# Patient Record
Sex: Male | Born: 1935 | Race: White | Hispanic: No | Marital: Single | Smoking: Former smoker
Health system: Southern US, Community
[De-identification: ages and names within clinical notes are randomized; demographics above are authoritative.]

## PROBLEM LIST (undated history)

## (undated) DIAGNOSIS — I459 Conduction disorder, unspecified: Secondary | ICD-10-CM

## (undated) DIAGNOSIS — K922 Gastrointestinal hemorrhage, unspecified: Secondary | ICD-10-CM

## (undated) DIAGNOSIS — I4891 Unspecified atrial fibrillation: Secondary | ICD-10-CM

## (undated) DIAGNOSIS — Z950 Presence of cardiac pacemaker: Secondary | ICD-10-CM

## (undated) HISTORY — PX: TRANSURETHRAL RESECTION OF PROSTATE: SHX73

## (undated) HISTORY — PX: HERNIA REPAIR: SHX51

## (undated) HISTORY — PX: CATARACT EXTRACTION, BILATERAL: SHX1313

---

## 2006-07-31 ENCOUNTER — Ambulatory Visit (INDEPENDENT_AMBULATORY_CARE_PROVIDER_SITE_OTHER): Admitting: Ophthalmology

## 2006-08-10 ENCOUNTER — Ambulatory Visit (INDEPENDENT_AMBULATORY_CARE_PROVIDER_SITE_OTHER): Admitting: Family

## 2006-08-14 ENCOUNTER — Encounter (INDEPENDENT_AMBULATORY_CARE_PROVIDER_SITE_OTHER): Payer: Self-pay | Admitting: Ophthalmology

## 2006-08-20 ENCOUNTER — Ambulatory Visit: Admitting: Ophthalmology

## 2006-08-28 ENCOUNTER — Ambulatory Visit (INDEPENDENT_AMBULATORY_CARE_PROVIDER_SITE_OTHER): Admitting: Ophthalmology

## 2006-09-25 ENCOUNTER — Ambulatory Visit (INDEPENDENT_AMBULATORY_CARE_PROVIDER_SITE_OTHER): Admitting: Ophthalmology

## 2006-10-09 ENCOUNTER — Ambulatory Visit (INDEPENDENT_AMBULATORY_CARE_PROVIDER_SITE_OTHER): Admitting: Ophthalmology

## 2006-10-16 ENCOUNTER — Ambulatory Visit (INDEPENDENT_AMBULATORY_CARE_PROVIDER_SITE_OTHER): Admitting: Ophthalmology

## 2006-11-13 ENCOUNTER — Encounter (INDEPENDENT_AMBULATORY_CARE_PROVIDER_SITE_OTHER): Admitting: Ophthalmology

## 2006-12-11 ENCOUNTER — Ambulatory Visit (INDEPENDENT_AMBULATORY_CARE_PROVIDER_SITE_OTHER): Admitting: Ophthalmology

## 2007-01-15 ENCOUNTER — Ambulatory Visit (INDEPENDENT_AMBULATORY_CARE_PROVIDER_SITE_OTHER): Admitting: Ophthalmology

## 2007-02-19 ENCOUNTER — Ambulatory Visit (INDEPENDENT_AMBULATORY_CARE_PROVIDER_SITE_OTHER): Admitting: Ophthalmology

## 2007-03-26 ENCOUNTER — Ambulatory Visit (INDEPENDENT_AMBULATORY_CARE_PROVIDER_SITE_OTHER): Admitting: Ophthalmology

## 2010-07-16 ENCOUNTER — Ambulatory Visit (INDEPENDENT_AMBULATORY_CARE_PROVIDER_SITE_OTHER): Admitting: Ophthalmology

## 2011-09-02 ENCOUNTER — Encounter (INDEPENDENT_AMBULATORY_CARE_PROVIDER_SITE_OTHER): Admitting: Ophthalmology

## 2011-09-02 ENCOUNTER — Ambulatory Visit (INDEPENDENT_AMBULATORY_CARE_PROVIDER_SITE_OTHER): Admitting: Ophthalmology

## 2012-12-14 ENCOUNTER — Ambulatory Visit (INDEPENDENT_AMBULATORY_CARE_PROVIDER_SITE_OTHER): Payer: Medicare Other | Admitting: Ophthalmology

## 2013-03-15 ENCOUNTER — Ambulatory Visit (INDEPENDENT_AMBULATORY_CARE_PROVIDER_SITE_OTHER): Payer: Medicare Other | Admitting: Ophthalmology

## 2013-06-28 ENCOUNTER — Ambulatory Visit (INDEPENDENT_AMBULATORY_CARE_PROVIDER_SITE_OTHER): Payer: Medicare Other | Admitting: Ophthalmology

## 2013-07-12 ENCOUNTER — Ambulatory Visit (INDEPENDENT_AMBULATORY_CARE_PROVIDER_SITE_OTHER): Payer: Medicare Other | Admitting: Ophthalmology

## 2014-02-14 ENCOUNTER — Ambulatory Visit (INDEPENDENT_AMBULATORY_CARE_PROVIDER_SITE_OTHER): Payer: Medicare Other | Admitting: Ophthalmology

## 2014-05-25 DIAGNOSIS — N400 Enlarged prostate without lower urinary tract symptoms: Secondary | ICD-10-CM | POA: Diagnosis not present

## 2014-05-25 DIAGNOSIS — E039 Hypothyroidism, unspecified: Secondary | ICD-10-CM | POA: Diagnosis not present

## 2014-05-25 DIAGNOSIS — I2510 Atherosclerotic heart disease of native coronary artery without angina pectoris: Secondary | ICD-10-CM | POA: Diagnosis not present

## 2014-05-27 DIAGNOSIS — M47816 Spondylosis without myelopathy or radiculopathy, lumbar region: Secondary | ICD-10-CM | POA: Diagnosis not present

## 2014-06-09 DIAGNOSIS — E039 Hypothyroidism, unspecified: Secondary | ICD-10-CM | POA: Diagnosis not present

## 2014-06-15 DIAGNOSIS — M47816 Spondylosis without myelopathy or radiculopathy, lumbar region: Secondary | ICD-10-CM | POA: Diagnosis not present

## 2014-07-13 DIAGNOSIS — I495 Sick sinus syndrome: Secondary | ICD-10-CM | POA: Diagnosis not present

## 2014-07-13 DIAGNOSIS — Z950 Presence of cardiac pacemaker: Secondary | ICD-10-CM | POA: Diagnosis not present

## 2014-07-14 DIAGNOSIS — I2510 Atherosclerotic heart disease of native coronary artery without angina pectoris: Secondary | ICD-10-CM | POA: Diagnosis not present

## 2014-07-14 DIAGNOSIS — Z950 Presence of cardiac pacemaker: Secondary | ICD-10-CM | POA: Diagnosis not present

## 2014-07-14 DIAGNOSIS — E782 Mixed hyperlipidemia: Secondary | ICD-10-CM | POA: Diagnosis not present

## 2014-07-14 DIAGNOSIS — I10 Essential (primary) hypertension: Secondary | ICD-10-CM | POA: Diagnosis not present

## 2014-07-23 DIAGNOSIS — J387 Other diseases of larynx: Secondary | ICD-10-CM | POA: Diagnosis not present

## 2014-07-23 DIAGNOSIS — R1310 Dysphagia, unspecified: Secondary | ICD-10-CM | POA: Diagnosis not present

## 2014-08-13 DIAGNOSIS — M5136 Other intervertebral disc degeneration, lumbar region: Secondary | ICD-10-CM | POA: Diagnosis not present

## 2014-08-21 DIAGNOSIS — Z006 Encounter for examination for normal comparison and control in clinical research program: Secondary | ICD-10-CM | POA: Diagnosis not present

## 2014-08-21 DIAGNOSIS — Z950 Presence of cardiac pacemaker: Secondary | ICD-10-CM | POA: Diagnosis not present

## 2014-08-21 DIAGNOSIS — I495 Sick sinus syndrome: Secondary | ICD-10-CM | POA: Diagnosis not present

## 2014-08-21 DIAGNOSIS — R55 Syncope and collapse: Secondary | ICD-10-CM | POA: Diagnosis not present

## 2014-08-29 ENCOUNTER — Encounter (INDEPENDENT_AMBULATORY_CARE_PROVIDER_SITE_OTHER): Payer: Medicare Other | Admitting: Ophthalmology

## 2014-09-02 DIAGNOSIS — M5136 Other intervertebral disc degeneration, lumbar region: Secondary | ICD-10-CM | POA: Diagnosis not present

## 2014-09-02 DIAGNOSIS — M5431 Sciatica, right side: Secondary | ICD-10-CM | POA: Diagnosis not present

## 2014-09-05 ENCOUNTER — Encounter (INDEPENDENT_AMBULATORY_CARE_PROVIDER_SITE_OTHER): Payer: Medicare Other | Admitting: Ophthalmology

## 2014-09-05 DIAGNOSIS — H4011X3 Primary open-angle glaucoma, severe stage: Secondary | ICD-10-CM | POA: Diagnosis not present

## 2014-10-12 DIAGNOSIS — M47816 Spondylosis without myelopathy or radiculopathy, lumbar region: Secondary | ICD-10-CM | POA: Diagnosis not present

## 2014-12-03 DIAGNOSIS — L570 Actinic keratosis: Secondary | ICD-10-CM | POA: Diagnosis not present

## 2014-12-03 DIAGNOSIS — L578 Other skin changes due to chronic exposure to nonionizing radiation: Secondary | ICD-10-CM | POA: Diagnosis not present

## 2014-12-03 DIAGNOSIS — L820 Inflamed seborrheic keratosis: Secondary | ICD-10-CM | POA: Diagnosis not present

## 2014-12-03 DIAGNOSIS — Z85828 Personal history of other malignant neoplasm of skin: Secondary | ICD-10-CM | POA: Diagnosis not present

## 2014-12-10 DIAGNOSIS — M545 Low back pain: Secondary | ICD-10-CM | POA: Diagnosis not present

## 2014-12-14 DIAGNOSIS — I495 Sick sinus syndrome: Secondary | ICD-10-CM | POA: Diagnosis not present

## 2014-12-14 DIAGNOSIS — Z950 Presence of cardiac pacemaker: Secondary | ICD-10-CM | POA: Diagnosis not present

## 2014-12-31 DIAGNOSIS — S86892A Other injury of other muscle(s) and tendon(s) at lower leg level, left leg, initial encounter: Secondary | ICD-10-CM | POA: Diagnosis not present

## 2015-01-05 DIAGNOSIS — K219 Gastro-esophageal reflux disease without esophagitis: Secondary | ICD-10-CM | POA: Diagnosis not present

## 2015-01-05 DIAGNOSIS — K5909 Other constipation: Secondary | ICD-10-CM | POA: Diagnosis not present

## 2015-01-06 DIAGNOSIS — I10 Essential (primary) hypertension: Secondary | ICD-10-CM | POA: Diagnosis not present

## 2015-01-06 DIAGNOSIS — E782 Mixed hyperlipidemia: Secondary | ICD-10-CM | POA: Diagnosis not present

## 2015-01-21 DIAGNOSIS — I2510 Atherosclerotic heart disease of native coronary artery without angina pectoris: Secondary | ICD-10-CM | POA: Diagnosis not present

## 2015-01-21 DIAGNOSIS — I361 Nonrheumatic tricuspid (valve) insufficiency: Secondary | ICD-10-CM | POA: Diagnosis not present

## 2015-01-21 DIAGNOSIS — L570 Actinic keratosis: Secondary | ICD-10-CM | POA: Diagnosis not present

## 2015-01-21 DIAGNOSIS — Z85828 Personal history of other malignant neoplasm of skin: Secondary | ICD-10-CM | POA: Diagnosis not present

## 2015-01-21 DIAGNOSIS — L578 Other skin changes due to chronic exposure to nonionizing radiation: Secondary | ICD-10-CM | POA: Diagnosis not present

## 2015-01-21 DIAGNOSIS — L820 Inflamed seborrheic keratosis: Secondary | ICD-10-CM | POA: Diagnosis not present

## 2015-01-27 DIAGNOSIS — R011 Cardiac murmur, unspecified: Secondary | ICD-10-CM | POA: Diagnosis not present

## 2015-01-27 DIAGNOSIS — I2510 Atherosclerotic heart disease of native coronary artery without angina pectoris: Secondary | ICD-10-CM | POA: Diagnosis not present

## 2015-01-27 DIAGNOSIS — Z950 Presence of cardiac pacemaker: Secondary | ICD-10-CM | POA: Diagnosis not present

## 2015-01-27 DIAGNOSIS — E782 Mixed hyperlipidemia: Secondary | ICD-10-CM | POA: Diagnosis not present

## 2015-01-28 DIAGNOSIS — K5909 Other constipation: Secondary | ICD-10-CM | POA: Diagnosis not present

## 2015-02-01 DIAGNOSIS — M5416 Radiculopathy, lumbar region: Secondary | ICD-10-CM | POA: Diagnosis not present

## 2015-02-19 DIAGNOSIS — L820 Inflamed seborrheic keratosis: Secondary | ICD-10-CM | POA: Diagnosis not present

## 2015-02-23 DIAGNOSIS — K5900 Constipation, unspecified: Secondary | ICD-10-CM | POA: Diagnosis not present

## 2015-02-27 ENCOUNTER — Encounter (INDEPENDENT_AMBULATORY_CARE_PROVIDER_SITE_OTHER): Payer: Medicare Other | Admitting: Ophthalmology

## 2015-03-08 DIAGNOSIS — M5136 Other intervertebral disc degeneration, lumbar region: Secondary | ICD-10-CM | POA: Diagnosis not present

## 2015-03-08 DIAGNOSIS — Z23 Encounter for immunization: Secondary | ICD-10-CM | POA: Diagnosis not present

## 2015-03-08 DIAGNOSIS — M1990 Unspecified osteoarthritis, unspecified site: Secondary | ICD-10-CM | POA: Diagnosis not present

## 2015-03-08 DIAGNOSIS — M5431 Sciatica, right side: Secondary | ICD-10-CM | POA: Diagnosis not present

## 2015-03-08 DIAGNOSIS — R252 Cramp and spasm: Secondary | ICD-10-CM | POA: Diagnosis not present

## 2015-03-09 DIAGNOSIS — I10 Essential (primary) hypertension: Secondary | ICD-10-CM | POA: Diagnosis not present

## 2015-03-09 DIAGNOSIS — M1380 Other specified arthritis, unspecified site: Secondary | ICD-10-CM | POA: Diagnosis not present

## 2015-03-09 DIAGNOSIS — M62830 Muscle spasm of back: Secondary | ICD-10-CM | POA: Diagnosis not present

## 2015-03-13 ENCOUNTER — Encounter (INDEPENDENT_AMBULATORY_CARE_PROVIDER_SITE_OTHER): Payer: Medicare Other | Admitting: Ophthalmology

## 2015-03-13 DIAGNOSIS — H401132 Primary open-angle glaucoma, bilateral, moderate stage: Secondary | ICD-10-CM | POA: Diagnosis not present

## 2015-03-30 DIAGNOSIS — Z006 Encounter for examination for normal comparison and control in clinical research program: Secondary | ICD-10-CM | POA: Diagnosis not present

## 2015-03-30 DIAGNOSIS — Z950 Presence of cardiac pacemaker: Secondary | ICD-10-CM | POA: Diagnosis not present

## 2015-04-01 DIAGNOSIS — M47816 Spondylosis without myelopathy or radiculopathy, lumbar region: Secondary | ICD-10-CM | POA: Diagnosis not present

## 2015-04-01 DIAGNOSIS — M1990 Unspecified osteoarthritis, unspecified site: Secondary | ICD-10-CM | POA: Diagnosis not present

## 2015-04-01 DIAGNOSIS — M5416 Radiculopathy, lumbar region: Secondary | ICD-10-CM | POA: Diagnosis not present

## 2015-04-09 DIAGNOSIS — M79662 Pain in left lower leg: Secondary | ICD-10-CM | POA: Diagnosis not present

## 2015-04-09 DIAGNOSIS — M7989 Other specified soft tissue disorders: Secondary | ICD-10-CM | POA: Diagnosis not present

## 2015-05-31 DIAGNOSIS — M545 Low back pain: Secondary | ICD-10-CM | POA: Diagnosis not present

## 2015-06-28 DIAGNOSIS — M47816 Spondylosis without myelopathy or radiculopathy, lumbar region: Secondary | ICD-10-CM | POA: Diagnosis not present

## 2015-06-30 DIAGNOSIS — R252 Cramp and spasm: Secondary | ICD-10-CM | POA: Diagnosis not present

## 2015-06-30 DIAGNOSIS — I10 Essential (primary) hypertension: Secondary | ICD-10-CM | POA: Diagnosis not present

## 2015-06-30 DIAGNOSIS — M5136 Other intervertebral disc degeneration, lumbar region: Secondary | ICD-10-CM | POA: Diagnosis not present

## 2015-07-16 DIAGNOSIS — M79605 Pain in left leg: Secondary | ICD-10-CM | POA: Diagnosis not present

## 2015-07-16 DIAGNOSIS — R531 Weakness: Secondary | ICD-10-CM | POA: Diagnosis not present

## 2015-07-16 DIAGNOSIS — M545 Low back pain: Secondary | ICD-10-CM | POA: Diagnosis not present

## 2015-07-19 DIAGNOSIS — I459 Conduction disorder, unspecified: Secondary | ICD-10-CM | POA: Diagnosis not present

## 2015-07-19 DIAGNOSIS — Z950 Presence of cardiac pacemaker: Secondary | ICD-10-CM | POA: Diagnosis not present

## 2015-07-19 DIAGNOSIS — M79605 Pain in left leg: Secondary | ICD-10-CM | POA: Diagnosis not present

## 2015-07-19 DIAGNOSIS — I495 Sick sinus syndrome: Secondary | ICD-10-CM | POA: Diagnosis not present

## 2015-07-19 DIAGNOSIS — M545 Low back pain: Secondary | ICD-10-CM | POA: Diagnosis not present

## 2015-07-19 DIAGNOSIS — R531 Weakness: Secondary | ICD-10-CM | POA: Diagnosis not present

## 2015-07-21 DIAGNOSIS — R531 Weakness: Secondary | ICD-10-CM | POA: Diagnosis not present

## 2015-07-21 DIAGNOSIS — M545 Low back pain: Secondary | ICD-10-CM | POA: Diagnosis not present

## 2015-07-21 DIAGNOSIS — M79605 Pain in left leg: Secondary | ICD-10-CM | POA: Diagnosis not present

## 2015-07-22 DIAGNOSIS — E782 Mixed hyperlipidemia: Secondary | ICD-10-CM | POA: Diagnosis not present

## 2015-07-22 DIAGNOSIS — I10 Essential (primary) hypertension: Secondary | ICD-10-CM | POA: Diagnosis not present

## 2015-07-26 DIAGNOSIS — R531 Weakness: Secondary | ICD-10-CM | POA: Diagnosis not present

## 2015-07-26 DIAGNOSIS — M545 Low back pain: Secondary | ICD-10-CM | POA: Diagnosis not present

## 2015-07-26 DIAGNOSIS — M79605 Pain in left leg: Secondary | ICD-10-CM | POA: Diagnosis not present

## 2015-07-27 DIAGNOSIS — I10 Essential (primary) hypertension: Secondary | ICD-10-CM | POA: Diagnosis not present

## 2015-07-27 DIAGNOSIS — I2510 Atherosclerotic heart disease of native coronary artery without angina pectoris: Secondary | ICD-10-CM | POA: Diagnosis not present

## 2015-07-27 DIAGNOSIS — Z950 Presence of cardiac pacemaker: Secondary | ICD-10-CM | POA: Diagnosis not present

## 2015-07-27 DIAGNOSIS — E782 Mixed hyperlipidemia: Secondary | ICD-10-CM | POA: Diagnosis not present

## 2015-08-02 DIAGNOSIS — M79605 Pain in left leg: Secondary | ICD-10-CM | POA: Diagnosis not present

## 2015-08-02 DIAGNOSIS — M545 Low back pain: Secondary | ICD-10-CM | POA: Diagnosis not present

## 2015-08-02 DIAGNOSIS — R531 Weakness: Secondary | ICD-10-CM | POA: Diagnosis not present

## 2015-08-04 DIAGNOSIS — R531 Weakness: Secondary | ICD-10-CM | POA: Diagnosis not present

## 2015-08-04 DIAGNOSIS — M545 Low back pain: Secondary | ICD-10-CM | POA: Diagnosis not present

## 2015-08-04 DIAGNOSIS — M79605 Pain in left leg: Secondary | ICD-10-CM | POA: Diagnosis not present

## 2015-08-23 DIAGNOSIS — M47816 Spondylosis without myelopathy or radiculopathy, lumbar region: Secondary | ICD-10-CM | POA: Diagnosis not present

## 2015-09-04 ENCOUNTER — Ambulatory Visit (INDEPENDENT_AMBULATORY_CARE_PROVIDER_SITE_OTHER): Payer: Medicare Other | Admitting: Ophthalmology

## 2015-09-18 ENCOUNTER — Ambulatory Visit (INDEPENDENT_AMBULATORY_CARE_PROVIDER_SITE_OTHER): Payer: Medicare Other | Admitting: Ophthalmology

## 2015-09-25 ENCOUNTER — Ambulatory Visit (INDEPENDENT_AMBULATORY_CARE_PROVIDER_SITE_OTHER): Payer: Medicare Other | Admitting: Ophthalmology

## 2015-09-25 DIAGNOSIS — H401132 Primary open-angle glaucoma, bilateral, moderate stage: Secondary | ICD-10-CM | POA: Diagnosis not present

## 2015-09-29 DIAGNOSIS — K5900 Constipation, unspecified: Secondary | ICD-10-CM | POA: Diagnosis not present

## 2015-09-29 DIAGNOSIS — R05 Cough: Secondary | ICD-10-CM | POA: Diagnosis not present

## 2015-09-29 DIAGNOSIS — E559 Vitamin D deficiency, unspecified: Secondary | ICD-10-CM | POA: Diagnosis not present

## 2015-10-11 DIAGNOSIS — M47816 Spondylosis without myelopathy or radiculopathy, lumbar region: Secondary | ICD-10-CM | POA: Diagnosis not present

## 2015-10-15 DIAGNOSIS — N419 Inflammatory disease of prostate, unspecified: Secondary | ICD-10-CM | POA: Diagnosis not present

## 2015-10-15 DIAGNOSIS — R3129 Other microscopic hematuria: Secondary | ICD-10-CM | POA: Diagnosis not present

## 2015-10-21 DIAGNOSIS — L819 Disorder of pigmentation, unspecified: Secondary | ICD-10-CM | POA: Diagnosis not present

## 2015-10-21 DIAGNOSIS — L570 Actinic keratosis: Secondary | ICD-10-CM | POA: Diagnosis not present

## 2015-10-21 DIAGNOSIS — L820 Inflamed seborrheic keratosis: Secondary | ICD-10-CM | POA: Diagnosis not present

## 2015-10-21 DIAGNOSIS — D485 Neoplasm of uncertain behavior of skin: Secondary | ICD-10-CM | POA: Diagnosis not present

## 2015-10-21 DIAGNOSIS — L80 Vitiligo: Secondary | ICD-10-CM | POA: Diagnosis not present

## 2015-10-21 DIAGNOSIS — L578 Other skin changes due to chronic exposure to nonionizing radiation: Secondary | ICD-10-CM | POA: Diagnosis not present

## 2015-10-21 DIAGNOSIS — L821 Other seborrheic keratosis: Secondary | ICD-10-CM | POA: Diagnosis not present

## 2015-10-25 DIAGNOSIS — N419 Inflammatory disease of prostate, unspecified: Secondary | ICD-10-CM | POA: Diagnosis not present

## 2015-11-11 DIAGNOSIS — N358 Other urethral stricture: Secondary | ICD-10-CM | POA: Diagnosis not present

## 2015-11-11 DIAGNOSIS — N398 Other specified disorders of urinary system: Secondary | ICD-10-CM | POA: Diagnosis not present

## 2015-11-11 DIAGNOSIS — N359 Urethral stricture, unspecified: Secondary | ICD-10-CM | POA: Diagnosis not present

## 2015-11-15 DIAGNOSIS — N419 Inflammatory disease of prostate, unspecified: Secondary | ICD-10-CM | POA: Diagnosis not present

## 2015-11-15 DIAGNOSIS — Z950 Presence of cardiac pacemaker: Secondary | ICD-10-CM | POA: Diagnosis not present

## 2016-02-13 DIAGNOSIS — Z23 Encounter for immunization: Secondary | ICD-10-CM | POA: Diagnosis not present

## 2016-04-10 ENCOUNTER — Ambulatory Visit
Admission: EM | Admit: 2016-04-10 | Discharge: 2016-04-10 | Disposition: A | Discharge status: Home / Self Care | Payer: Medicare Other | Attending: Family Medicine | Admitting: Family Medicine

## 2016-04-10 ENCOUNTER — Encounter: Payer: Self-pay | Admitting: *Deleted

## 2016-04-10 ENCOUNTER — Ambulatory Visit: Payer: Medicare Other

## 2016-04-10 DIAGNOSIS — R1033 Periumbilical pain: Secondary | ICD-10-CM | POA: Diagnosis not present

## 2016-04-10 DIAGNOSIS — R1084 Generalized abdominal pain: Secondary | ICD-10-CM | POA: Diagnosis present

## 2016-04-10 DIAGNOSIS — K5900 Constipation, unspecified: Secondary | ICD-10-CM

## 2016-04-10 HISTORY — DX: Presence of cardiac pacemaker: Z95.0

## 2016-04-10 HISTORY — DX: Unspecified atrial fibrillation: I48.91

## 2016-04-10 HISTORY — DX: Conduction disorder, unspecified: I45.9

## 2016-04-10 HISTORY — DX: Gastrointestinal hemorrhage, unspecified: K92.2

## 2016-04-10 LAB — COMPREHENSIVE METABOLIC PANEL
ALBUMIN: 4.2 g/dL (ref 3.5–5.0)
ALK PHOS: 72 U/L (ref 38–126)
ALT: 47 U/L (ref 17–63)
ANION GAP: 7 (ref 5–15)
AST: 32 U/L (ref 15–41)
BILIRUBIN TOTAL: 0.4 mg/dL (ref 0.3–1.2)
BUN: 21 mg/dL — AB (ref 6–20)
CALCIUM: 9 mg/dL (ref 8.9–10.3)
CO2: 26 mmol/L (ref 22–32)
Chloride: 99 mmol/L — ABNORMAL LOW (ref 101–111)
Creatinine, Ser: 1.06 mg/dL (ref 0.61–1.24)
GFR calc Af Amer: >60 mL/min (ref >60)
GFR calc non Af Amer: >60 mL/min (ref >60)
GLUCOSE: 109 mg/dL — AB (ref 65–99)
Potassium: 5.1 mmol/L (ref 3.5–5.1)
Sodium: 132 mmol/L — ABNORMAL LOW (ref 135–145)
TOTAL PROTEIN: 6.9 g/dL (ref 6.5–8.1)

## 2016-04-10 LAB — CBC WITH DIFFERENTIAL/PLATELET
BASOS PCT: 1 %
Basophils Absolute: 0 10*3/uL (ref 0–0.1)
Eosinophils Absolute: 0.2 10*3/uL (ref 0–0.7)
Eosinophils Relative: 2 %
HEMATOCRIT: 39.4 % — AB (ref 40.0–52.0)
HEMOGLOBIN: 13.7 g/dL (ref 13.0–18.0)
LYMPHS ABS: 1.4 10*3/uL (ref 1.0–3.6)
LYMPHS PCT: 15 %
MCH: 31.6 pg (ref 26.0–34.0)
MCHC: 34.7 g/dL (ref 32.0–36.0)
MCV: 91.1 fL (ref 80.0–100.0)
MONOS PCT: 7 %
Monocytes Absolute: 0.7 10*3/uL (ref 0.2–1.0)
NEUTROS ABS: 6.8 10*3/uL — AB (ref 1.4–6.5)
NEUTROS PCT: 75 %
Platelets: 247 10*3/uL (ref 150–440)
RBC: 4.33 MIL/uL — ABNORMAL LOW (ref 4.40–5.90)
RDW: 13.6 % (ref 11.5–14.5)
WBC: 9.2 10*3/uL (ref 3.8–10.6)

## 2016-04-10 NOTE — ED Provider Notes (Signed)
[facility]    CSN: [id number] Arrival date & time: 04/10/16  1448     History   Chief Complaint Chief Complaint  Patient presents with  . Abdominal Pain    HPI Mark Suarez is a 82 y.o. male.   82 yo male with a c/o generalized abdominal pains for the last 3-4 weeks. States he has a h/o intermittent constipation due to his chronic pain medication and usually takes Miralax every 3 days. Denies any melena, hematochezia, fevers, chills, vomiting.    The history is provided by the patient.  Abdominal Pain    Past Medical History:  Diagnosis Date  . Atrial fibrillation ([place])   . GI bleed   . Heart block   . Pacemaker     There are no active problems to display for this patient.   Past Surgical History:  Procedure Laterality Date  . CATARACT EXTRACTION, BILATERAL    . HERNIA REPAIR    . TRANSURETHRAL RESECTION OF PROSTATE         Home Medications    Prior to Admission medications   Medication Sig Start Date End Date Taking? Authorizing Provider  aspirin 81 MG chewable tablet Chew 81 mg by mouth daily.   Yes Historical Provider, MD  atorvastatin (LIPITOR) 20 MG tablet Take 20 mg by mouth daily.   Yes Historical Provider, MD  buprenorphine (BUTRANS) 20 MCG/HR PTWK patch Place 20 mcg onto the skin once a week.   Yes Historical Provider, MD  cholecalciferol (VITAMIN D) 1000 units tablet Take 2,000 Units by mouth daily.   Yes Historical Provider, MD  gabapentin (NEURONTIN) 300 MG capsule Take 300 mg by mouth daily.   Yes Historical Provider, MD  levothyroxine (SYNTHROID) 100 MCG tablet Take 100 mcg by mouth daily before breakfast.   Yes Historical Provider, MD  LORazepam (ATIVAN) 1 MG tablet Take 1 mg by mouth daily.   Yes Historical Provider, MD  losartan (COZAAR) 50 MG tablet Take 50 mg by mouth daily.   Yes Historical Provider, MD  metoprolol tartrate (LOPRESSOR) 25 MG tablet Take 25 mg by mouth daily.   Yes Historical Provider, MD    Family  History History reviewed. No pertinent family history.  Social History Social History  Substance Use Topics  . Smoking status: Former Research scientist (life sciences)  . Smokeless tobacco: Never Used  . Alcohol use Yes     Allergies   Patient has no known allergies.   Review of Systems Review of Systems  Gastrointestinal: Positive for abdominal pain.     Physical Exam Triage Vital Signs ED Triage Vitals  Enc Vitals Group     BP 04/10/16 1517 (!) 147/67     Pulse Rate 04/10/16 1517 62     Resp 04/10/16 1517 16     Temp 04/10/16 1517 97.6 F (36.4 C)     Temp Source 04/10/16 1517 Oral     SpO2 04/10/16 1517 100 %     Weight 04/10/16 1520 160 lb (72.6 kg)     Height 04/10/16 1520 5\' 8"  (1.727 m)     Head Circumference --      Peak Flow --      Pain Score 04/10/16 1531 3     Pain Loc --      Pain Edu? --      Excl. in [place]? --    No data found.   Updated Vital Signs BP (!) 147/67 (BP Location: Left Arm)   Pulse 62   Temp  97.6 F (36.4 C) (Oral)   Resp 16   Ht 5\' 8"  (1.727 m)   Wt 160 lb (72.6 kg)   SpO2 100%   BMI 24.33 kg/m   Visual Acuity Right Eye Distance:   Left Eye Distance:   Bilateral Distance:    Right Eye Near:   Left Eye Near:    Bilateral Near:     Physical Exam  Constitutional: He is oriented to person, place, and time. He appears well-developed and well-nourished. No distress.  HENT:  Head: Normocephalic and atraumatic.  Cardiovascular: Normal rate, regular rhythm, normal heart sounds and intact distal pulses.   No murmur heard. Pulmonary/Chest: Effort normal and breath sounds normal. No respiratory distress. He has no wheezes. He has no rales.  Abdominal: Soft. Bowel sounds are normal. He exhibits no distension and no mass. There is tenderness (mild, diffuse; no rebound or guarding). There is no rebound and no guarding.  Neurological: He is alert and oriented to person, place, and time.  Skin: No rash noted. He is not diaphoretic.  Nursing note and vitals  reviewed.    UC Treatments / Results  Labs (all labs ordered are listed, but only abnormal results are displayed) Labs Reviewed  CBC WITH DIFFERENTIAL/PLATELET - Abnormal; Notable for the following:       Result Value   RBC 4.33 (*)    HCT 39.4 (*)    Neutro Abs 6.8 (*)    All other components within normal limits  COMPREHENSIVE METABOLIC PANEL - Abnormal; Notable for the following:    Sodium 132 (*)    Chloride 99 (*)    Glucose, Bld 109 (*)    BUN 21 (*)    All other components within normal limits    EKG  EKG Interpretation None       Radiology Dg Abd 2 Views  Result Date: 04/10/2016 CLINICAL DATA:  Diffuse lower abdominal pain and periumbilical pain EXAM: ABDOMEN - 2 VIEW COMPARISON:  None. FINDINGS: Visualized lung bases clear. Partially visualized right atrial and right ventricular pacer leads. Bowel gas pattern is nonobstructed. Large amount of impacted feces in the right colon. Aortic vascular calcifications. Postsurgical changes lower pelvis. No acute osseous abnormality. IMPRESSION: Non obstructed gas pattern with large amount of right colon stool. Electronically Signed   By: Donavan Foil M.D.   On: 04/10/2016 16:34    Procedures Procedures (including critical care time)  Medications Ordered in UC Medications - No data to display   Initial Impression / Assessment and Plan / UC Course  I have reviewed the triage vital signs and the nursing notes.  Pertinent labs & imaging results that were available during my care of the patient were reviewed by me and considered in my medical decision making (see chart for details).  Clinical Course       Final Clinical Impressions(s) / UC Diagnoses   Final diagnoses:  Constipation, unspecified constipation type    New Prescriptions Discharge Medication List as of 04/10/2016  4:42 PM     1. Labs/x-ray results and diagnosis reviewed with patient 2. rx as per orders above; reviewed possible side effects,  interactions, risks and benefits  3. Recommend supportive treatment with increased water, increased Miralax frequency (daily), fleet enema 4. Follow-up prn if symptoms worsen or don't improve   Norval Gable, MD 04/10/16 2020

## 2016-04-10 NOTE — ED Triage Notes (Signed)
Patient has been having generalized abdominal pain for 3 to 4 weeks. No previous abdominal issues reported.

## 2016-06-06 DIAGNOSIS — R1084 Generalized abdominal pain: Secondary | ICD-10-CM | POA: Diagnosis not present

## 2016-06-06 DIAGNOSIS — K30 Functional dyspepsia: Secondary | ICD-10-CM | POA: Diagnosis not present

## 2016-06-06 DIAGNOSIS — K5909 Other constipation: Secondary | ICD-10-CM | POA: Diagnosis not present

## 2016-06-07 DIAGNOSIS — M48061 Spinal stenosis, lumbar region without neurogenic claudication: Secondary | ICD-10-CM | POA: Diagnosis not present

## 2016-06-07 DIAGNOSIS — K5900 Constipation, unspecified: Secondary | ICD-10-CM | POA: Diagnosis not present

## 2016-06-12 DIAGNOSIS — M47816 Spondylosis without myelopathy or radiculopathy, lumbar region: Secondary | ICD-10-CM | POA: Diagnosis not present

## 2016-06-14 DIAGNOSIS — Z950 Presence of cardiac pacemaker: Secondary | ICD-10-CM | POA: Diagnosis not present

## 2016-06-23 DIAGNOSIS — Z9861 Coronary angioplasty status: Secondary | ICD-10-CM | POA: Diagnosis not present

## 2016-06-23 DIAGNOSIS — I10 Essential (primary) hypertension: Secondary | ICD-10-CM | POA: Diagnosis not present

## 2016-06-23 DIAGNOSIS — I2510 Atherosclerotic heart disease of native coronary artery without angina pectoris: Secondary | ICD-10-CM | POA: Diagnosis not present

## 2016-06-23 DIAGNOSIS — E782 Mixed hyperlipidemia: Secondary | ICD-10-CM | POA: Diagnosis not present

## 2016-06-24 ENCOUNTER — Encounter (INDEPENDENT_AMBULATORY_CARE_PROVIDER_SITE_OTHER): Payer: Medicare Other | Admitting: Ophthalmology

## 2016-06-24 DIAGNOSIS — H401190 Primary open-angle glaucoma, unspecified eye, stage unspecified: Secondary | ICD-10-CM | POA: Diagnosis not present

## 2016-06-24 DIAGNOSIS — H401132 Primary open-angle glaucoma, bilateral, moderate stage: Secondary | ICD-10-CM | POA: Diagnosis not present

## 2016-06-27 DIAGNOSIS — D649 Anemia, unspecified: Secondary | ICD-10-CM | POA: Diagnosis not present

## 2016-06-27 DIAGNOSIS — D500 Iron deficiency anemia secondary to blood loss (chronic): Secondary | ICD-10-CM | POA: Diagnosis not present

## 2016-06-27 DIAGNOSIS — R5382 Chronic fatigue, unspecified: Secondary | ICD-10-CM | POA: Diagnosis not present

## 2016-06-27 DIAGNOSIS — E782 Mixed hyperlipidemia: Secondary | ICD-10-CM | POA: Diagnosis not present

## 2016-06-27 DIAGNOSIS — E559 Vitamin D deficiency, unspecified: Secondary | ICD-10-CM | POA: Diagnosis not present

## 2016-06-27 DIAGNOSIS — R002 Palpitations: Secondary | ICD-10-CM | POA: Diagnosis not present

## 2016-07-04 DIAGNOSIS — K5909 Other constipation: Secondary | ICD-10-CM | POA: Diagnosis not present

## 2016-07-04 DIAGNOSIS — R109 Unspecified abdominal pain: Secondary | ICD-10-CM | POA: Diagnosis not present

## 2016-07-05 DIAGNOSIS — R079 Chest pain, unspecified: Secondary | ICD-10-CM | POA: Diagnosis not present

## 2016-07-05 DIAGNOSIS — R3129 Other microscopic hematuria: Secondary | ICD-10-CM | POA: Diagnosis not present

## 2016-07-05 DIAGNOSIS — N419 Inflammatory disease of prostate, unspecified: Secondary | ICD-10-CM | POA: Diagnosis not present

## 2016-07-13 DIAGNOSIS — R194 Change in bowel habit: Secondary | ICD-10-CM | POA: Diagnosis not present

## 2016-07-13 DIAGNOSIS — K5730 Diverticulosis of large intestine without perforation or abscess without bleeding: Secondary | ICD-10-CM | POA: Diagnosis not present

## 2016-07-13 DIAGNOSIS — Z79891 Long term (current) use of opiate analgesic: Secondary | ICD-10-CM | POA: Diagnosis not present

## 2016-07-13 DIAGNOSIS — K5909 Other constipation: Secondary | ICD-10-CM | POA: Diagnosis not present

## 2016-07-13 DIAGNOSIS — I10 Essential (primary) hypertension: Secondary | ICD-10-CM | POA: Diagnosis not present

## 2016-07-13 DIAGNOSIS — E785 Hyperlipidemia, unspecified: Secondary | ICD-10-CM | POA: Diagnosis not present

## 2016-07-13 DIAGNOSIS — T40605A Adverse effect of unspecified narcotics, initial encounter: Secondary | ICD-10-CM | POA: Diagnosis not present

## 2016-07-13 DIAGNOSIS — R1013 Epigastric pain: Secondary | ICD-10-CM | POA: Diagnosis not present

## 2016-07-13 DIAGNOSIS — R109 Unspecified abdominal pain: Secondary | ICD-10-CM | POA: Diagnosis not present

## 2016-07-13 DIAGNOSIS — E039 Hypothyroidism, unspecified: Secondary | ICD-10-CM | POA: Diagnosis not present

## 2016-07-13 DIAGNOSIS — K5903 Drug induced constipation: Secondary | ICD-10-CM | POA: Diagnosis not present

## 2016-07-13 DIAGNOSIS — G8929 Other chronic pain: Secondary | ICD-10-CM | POA: Diagnosis not present

## 2016-07-13 DIAGNOSIS — M549 Dorsalgia, unspecified: Secondary | ICD-10-CM | POA: Diagnosis not present

## 2016-07-20 DIAGNOSIS — R1013 Epigastric pain: Secondary | ICD-10-CM | POA: Diagnosis not present

## 2016-07-20 DIAGNOSIS — R109 Unspecified abdominal pain: Secondary | ICD-10-CM | POA: Diagnosis not present

## 2016-07-20 DIAGNOSIS — K598 Other specified functional intestinal disorders: Secondary | ICD-10-CM | POA: Diagnosis not present

## 2016-07-26 DIAGNOSIS — K6389 Other specified diseases of intestine: Secondary | ICD-10-CM | POA: Diagnosis not present

## 2016-07-26 DIAGNOSIS — R109 Unspecified abdominal pain: Secondary | ICD-10-CM | POA: Diagnosis not present

## 2016-07-26 DIAGNOSIS — K5900 Constipation, unspecified: Secondary | ICD-10-CM | POA: Diagnosis not present

## 2016-08-04 DIAGNOSIS — R12 Heartburn: Secondary | ICD-10-CM | POA: Diagnosis not present

## 2016-08-04 DIAGNOSIS — N419 Inflammatory disease of prostate, unspecified: Secondary | ICD-10-CM | POA: Diagnosis not present

## 2016-08-21 DIAGNOSIS — M47816 Spondylosis without myelopathy or radiculopathy, lumbar region: Secondary | ICD-10-CM | POA: Diagnosis not present

## 2016-08-23 DIAGNOSIS — M5412 Radiculopathy, cervical region: Secondary | ICD-10-CM | POA: Diagnosis not present

## 2016-09-11 DIAGNOSIS — Z950 Presence of cardiac pacemaker: Secondary | ICD-10-CM | POA: Diagnosis not present

## 2016-09-28 DIAGNOSIS — G894 Chronic pain syndrome: Secondary | ICD-10-CM | POA: Diagnosis not present

## 2016-10-04 DIAGNOSIS — K5900 Constipation, unspecified: Secondary | ICD-10-CM | POA: Diagnosis not present

## 2016-11-13 DIAGNOSIS — M5412 Radiculopathy, cervical region: Secondary | ICD-10-CM | POA: Diagnosis not present

## 2016-11-23 DIAGNOSIS — L578 Other skin changes due to chronic exposure to nonionizing radiation: Secondary | ICD-10-CM | POA: Diagnosis not present

## 2016-11-23 DIAGNOSIS — Z1283 Encounter for screening for malignant neoplasm of skin: Secondary | ICD-10-CM | POA: Diagnosis not present

## 2016-11-23 DIAGNOSIS — D225 Melanocytic nevi of trunk: Secondary | ICD-10-CM | POA: Diagnosis not present

## 2016-11-23 DIAGNOSIS — L570 Actinic keratosis: Secondary | ICD-10-CM | POA: Diagnosis not present

## 2016-11-23 DIAGNOSIS — L821 Other seborrheic keratosis: Secondary | ICD-10-CM | POA: Diagnosis not present

## 2016-11-23 DIAGNOSIS — D485 Neoplasm of uncertain behavior of skin: Secondary | ICD-10-CM | POA: Diagnosis not present

## 2016-11-23 DIAGNOSIS — L820 Inflamed seborrheic keratosis: Secondary | ICD-10-CM | POA: Diagnosis not present

## 2016-12-11 DIAGNOSIS — I495 Sick sinus syndrome: Secondary | ICD-10-CM | POA: Diagnosis not present

## 2016-12-20 DIAGNOSIS — Z955 Presence of coronary angioplasty implant and graft: Secondary | ICD-10-CM | POA: Diagnosis not present

## 2016-12-20 DIAGNOSIS — I350 Nonrheumatic aortic (valve) stenosis: Secondary | ICD-10-CM | POA: Diagnosis not present

## 2016-12-20 DIAGNOSIS — I340 Nonrheumatic mitral (valve) insufficiency: Secondary | ICD-10-CM | POA: Diagnosis not present

## 2016-12-20 DIAGNOSIS — R262 Difficulty in walking, not elsewhere classified: Secondary | ICD-10-CM | POA: Diagnosis not present

## 2016-12-20 DIAGNOSIS — I2510 Atherosclerotic heart disease of native coronary artery without angina pectoris: Secondary | ICD-10-CM | POA: Diagnosis not present

## 2016-12-20 DIAGNOSIS — R9431 Abnormal electrocardiogram [ECG] [EKG]: Secondary | ICD-10-CM | POA: Diagnosis not present

## 2017-01-12 DIAGNOSIS — E782 Mixed hyperlipidemia: Secondary | ICD-10-CM | POA: Diagnosis not present

## 2017-01-12 DIAGNOSIS — I10 Essential (primary) hypertension: Secondary | ICD-10-CM | POA: Diagnosis not present

## 2017-01-12 DIAGNOSIS — I350 Nonrheumatic aortic (valve) stenosis: Secondary | ICD-10-CM | POA: Diagnosis not present

## 2017-01-15 DIAGNOSIS — M5412 Radiculopathy, cervical region: Secondary | ICD-10-CM | POA: Diagnosis not present

## 2017-02-06 DIAGNOSIS — M5412 Radiculopathy, cervical region: Secondary | ICD-10-CM | POA: Diagnosis not present

## 2017-02-09 DIAGNOSIS — R311 Benign essential microscopic hematuria: Secondary | ICD-10-CM | POA: Diagnosis not present

## 2017-02-09 DIAGNOSIS — D494 Neoplasm of unspecified behavior of bladder: Secondary | ICD-10-CM | POA: Diagnosis not present

## 2017-02-09 DIAGNOSIS — E039 Hypothyroidism, unspecified: Secondary | ICD-10-CM | POA: Diagnosis not present

## 2017-02-09 DIAGNOSIS — R3129 Other microscopic hematuria: Secondary | ICD-10-CM | POA: Diagnosis not present

## 2017-02-09 DIAGNOSIS — N419 Inflammatory disease of prostate, unspecified: Secondary | ICD-10-CM | POA: Diagnosis not present

## 2017-02-13 DIAGNOSIS — N390 Urinary tract infection, site not specified: Secondary | ICD-10-CM | POA: Diagnosis not present

## 2017-02-13 DIAGNOSIS — R319 Hematuria, unspecified: Secondary | ICD-10-CM | POA: Diagnosis not present

## 2017-02-13 DIAGNOSIS — Z23 Encounter for immunization: Secondary | ICD-10-CM | POA: Diagnosis not present

## 2017-02-22 DIAGNOSIS — R3129 Other microscopic hematuria: Secondary | ICD-10-CM | POA: Diagnosis not present

## 2017-02-28 DIAGNOSIS — N419 Inflammatory disease of prostate, unspecified: Secondary | ICD-10-CM | POA: Diagnosis not present

## 2017-02-28 DIAGNOSIS — R079 Chest pain, unspecified: Secondary | ICD-10-CM | POA: Diagnosis not present

## 2017-03-12 DIAGNOSIS — I495 Sick sinus syndrome: Secondary | ICD-10-CM | POA: Diagnosis not present

## 2017-03-15 DIAGNOSIS — Z950 Presence of cardiac pacemaker: Secondary | ICD-10-CM | POA: Diagnosis not present

## 2017-03-15 DIAGNOSIS — R3129 Other microscopic hematuria: Secondary | ICD-10-CM | POA: Diagnosis not present

## 2017-03-15 DIAGNOSIS — D494 Neoplasm of unspecified behavior of bladder: Secondary | ICD-10-CM | POA: Diagnosis not present

## 2017-03-15 DIAGNOSIS — R39198 Other difficulties with micturition: Secondary | ICD-10-CM | POA: Diagnosis not present

## 2017-03-15 DIAGNOSIS — I10 Essential (primary) hypertension: Secondary | ICD-10-CM | POA: Diagnosis not present

## 2017-03-21 DIAGNOSIS — K219 Gastro-esophageal reflux disease without esophagitis: Secondary | ICD-10-CM | POA: Diagnosis not present

## 2017-03-21 DIAGNOSIS — R319 Hematuria, unspecified: Secondary | ICD-10-CM | POA: Diagnosis not present

## 2017-03-26 DIAGNOSIS — M5412 Radiculopathy, cervical region: Secondary | ICD-10-CM | POA: Diagnosis not present

## 2017-04-17 DIAGNOSIS — K5909 Other constipation: Secondary | ICD-10-CM | POA: Diagnosis not present

## 2017-05-02 DIAGNOSIS — K219 Gastro-esophageal reflux disease without esophagitis: Secondary | ICD-10-CM | POA: Diagnosis not present

## 2017-05-02 DIAGNOSIS — K5909 Other constipation: Secondary | ICD-10-CM | POA: Diagnosis not present

## 2017-05-02 DIAGNOSIS — R1013 Epigastric pain: Secondary | ICD-10-CM | POA: Diagnosis not present

## 2017-06-02 ENCOUNTER — Encounter (INDEPENDENT_AMBULATORY_CARE_PROVIDER_SITE_OTHER): Payer: Self-pay | Admitting: Ophthalmology

## 2017-06-02 ENCOUNTER — Encounter (INDEPENDENT_AMBULATORY_CARE_PROVIDER_SITE_OTHER): Payer: Medicare Other | Admitting: Ophthalmology

## 2017-06-02 DIAGNOSIS — H401132 Primary open-angle glaucoma, bilateral, moderate stage: Secondary | ICD-10-CM | POA: Diagnosis not present

## 2017-06-02 DIAGNOSIS — H353131 Nonexudative age-related macular degeneration, bilateral, early dry stage: Secondary | ICD-10-CM | POA: Diagnosis not present

## 2017-06-02 DIAGNOSIS — Z0100 Encounter for examination of eyes and vision without abnormal findings: Secondary | ICD-10-CM

## 2017-06-05 DIAGNOSIS — K5909 Other constipation: Secondary | ICD-10-CM | POA: Diagnosis not present

## 2017-06-05 DIAGNOSIS — R109 Unspecified abdominal pain: Secondary | ICD-10-CM | POA: Diagnosis not present

## 2017-06-11 DIAGNOSIS — R51 Headache: Secondary | ICD-10-CM | POA: Diagnosis not present

## 2017-06-14 DIAGNOSIS — R7989 Other specified abnormal findings of blood chemistry: Secondary | ICD-10-CM | POA: Diagnosis not present

## 2017-06-14 DIAGNOSIS — R5382 Chronic fatigue, unspecified: Secondary | ICD-10-CM | POA: Diagnosis not present

## 2017-06-14 DIAGNOSIS — E7800 Pure hypercholesterolemia, unspecified: Secondary | ICD-10-CM | POA: Diagnosis not present

## 2017-06-14 DIAGNOSIS — R002 Palpitations: Secondary | ICD-10-CM | POA: Diagnosis not present

## 2017-06-14 DIAGNOSIS — D500 Iron deficiency anemia secondary to blood loss (chronic): Secondary | ICD-10-CM | POA: Diagnosis not present

## 2017-06-18 DIAGNOSIS — G5601 Carpal tunnel syndrome, right upper limb: Secondary | ICD-10-CM | POA: Diagnosis not present

## 2017-06-18 DIAGNOSIS — G5602 Carpal tunnel syndrome, left upper limb: Secondary | ICD-10-CM | POA: Diagnosis not present

## 2017-06-25 DIAGNOSIS — M5412 Radiculopathy, cervical region: Secondary | ICD-10-CM | POA: Diagnosis not present

## 2017-06-26 DIAGNOSIS — I350 Nonrheumatic aortic (valve) stenosis: Secondary | ICD-10-CM | POA: Diagnosis not present

## 2017-06-26 DIAGNOSIS — I10 Essential (primary) hypertension: Secondary | ICD-10-CM | POA: Diagnosis not present

## 2017-06-26 DIAGNOSIS — E782 Mixed hyperlipidemia: Secondary | ICD-10-CM | POA: Diagnosis not present

## 2017-06-27 DIAGNOSIS — M4802 Spinal stenosis, cervical region: Secondary | ICD-10-CM | POA: Diagnosis not present

## 2017-06-27 DIAGNOSIS — M542 Cervicalgia: Secondary | ICD-10-CM | POA: Diagnosis not present

## 2017-06-27 DIAGNOSIS — M47812 Spondylosis without myelopathy or radiculopathy, cervical region: Secondary | ICD-10-CM | POA: Diagnosis not present

## 2017-06-27 DIAGNOSIS — M9971 Connective tissue and disc stenosis of intervertebral foramina of cervical region: Secondary | ICD-10-CM | POA: Diagnosis not present

## 2017-06-27 DIAGNOSIS — R208 Other disturbances of skin sensation: Secondary | ICD-10-CM | POA: Diagnosis not present

## 2017-07-03 DIAGNOSIS — K219 Gastro-esophageal reflux disease without esophagitis: Secondary | ICD-10-CM | POA: Diagnosis not present

## 2017-07-03 DIAGNOSIS — M48061 Spinal stenosis, lumbar region without neurogenic claudication: Secondary | ICD-10-CM | POA: Diagnosis not present

## 2017-07-05 DIAGNOSIS — I495 Sick sinus syndrome: Secondary | ICD-10-CM | POA: Diagnosis not present

## 2017-07-10 DIAGNOSIS — H532 Diplopia: Secondary | ICD-10-CM | POA: Diagnosis not present

## 2017-07-10 DIAGNOSIS — H401131 Primary open-angle glaucoma, bilateral, mild stage: Secondary | ICD-10-CM | POA: Diagnosis not present

## 2017-07-10 DIAGNOSIS — H5053 Vertical heterophoria: Secondary | ICD-10-CM | POA: Diagnosis not present

## 2017-07-10 DIAGNOSIS — H5201 Hypermetropia, right eye: Secondary | ICD-10-CM | POA: Diagnosis not present

## 2017-07-10 DIAGNOSIS — H35373 Puckering of macula, bilateral: Secondary | ICD-10-CM | POA: Diagnosis not present

## 2017-07-10 DIAGNOSIS — H5212 Myopia, left eye: Secondary | ICD-10-CM | POA: Diagnosis not present

## 2017-07-10 DIAGNOSIS — H53143 Visual discomfort, bilateral: Secondary | ICD-10-CM | POA: Diagnosis not present

## 2017-07-10 DIAGNOSIS — H52223 Regular astigmatism, bilateral: Secondary | ICD-10-CM | POA: Diagnosis not present

## 2017-08-14 DIAGNOSIS — H6123 Impacted cerumen, bilateral: Secondary | ICD-10-CM | POA: Diagnosis not present

## 2017-08-14 DIAGNOSIS — R498 Other voice and resonance disorders: Secondary | ICD-10-CM | POA: Diagnosis not present

## 2017-08-14 DIAGNOSIS — H903 Sensorineural hearing loss, bilateral: Secondary | ICD-10-CM | POA: Diagnosis not present

## 2017-08-16 DIAGNOSIS — R109 Unspecified abdominal pain: Secondary | ICD-10-CM | POA: Diagnosis not present

## 2017-08-17 DIAGNOSIS — G5601 Carpal tunnel syndrome, right upper limb: Secondary | ICD-10-CM | POA: Diagnosis not present

## 2017-09-27 DIAGNOSIS — M5416 Radiculopathy, lumbar region: Secondary | ICD-10-CM | POA: Diagnosis not present

## 2017-09-27 DIAGNOSIS — G894 Chronic pain syndrome: Secondary | ICD-10-CM | POA: Diagnosis not present

## 2017-10-01 DIAGNOSIS — N419 Inflammatory disease of prostate, unspecified: Secondary | ICD-10-CM | POA: Diagnosis not present

## 2017-10-01 DIAGNOSIS — N3001 Acute cystitis with hematuria: Secondary | ICD-10-CM | POA: Diagnosis not present

## 2017-10-01 DIAGNOSIS — D689 Coagulation defect, unspecified: Secondary | ICD-10-CM | POA: Diagnosis not present

## 2017-10-01 DIAGNOSIS — D494 Neoplasm of unspecified behavior of bladder: Secondary | ICD-10-CM | POA: Diagnosis not present

## 2017-10-01 DIAGNOSIS — R311 Benign essential microscopic hematuria: Secondary | ICD-10-CM | POA: Diagnosis not present

## 2017-10-02 DIAGNOSIS — E039 Hypothyroidism, unspecified: Secondary | ICD-10-CM | POA: Diagnosis not present

## 2017-10-02 DIAGNOSIS — R9721 Rising PSA following treatment for malignant neoplasm of prostate: Secondary | ICD-10-CM | POA: Diagnosis not present

## 2017-10-02 DIAGNOSIS — R319 Hematuria, unspecified: Secondary | ICD-10-CM | POA: Diagnosis not present

## 2017-10-02 DIAGNOSIS — R946 Abnormal results of thyroid function studies: Secondary | ICD-10-CM | POA: Diagnosis not present

## 2017-10-02 DIAGNOSIS — I10 Essential (primary) hypertension: Secondary | ICD-10-CM | POA: Diagnosis not present

## 2017-10-15 DIAGNOSIS — Z950 Presence of cardiac pacemaker: Secondary | ICD-10-CM | POA: Diagnosis not present

## 2017-10-18 DIAGNOSIS — G894 Chronic pain syndrome: Secondary | ICD-10-CM | POA: Diagnosis not present

## 2017-10-18 DIAGNOSIS — M1711 Unilateral primary osteoarthritis, right knee: Secondary | ICD-10-CM | POA: Diagnosis not present

## 2017-10-18 DIAGNOSIS — M5416 Radiculopathy, lumbar region: Secondary | ICD-10-CM | POA: Diagnosis not present

## 2017-11-24 ENCOUNTER — Ambulatory Visit (INDEPENDENT_AMBULATORY_CARE_PROVIDER_SITE_OTHER): Payer: Medicare Other | Admitting: Ophthalmology

## 2017-11-24 DIAGNOSIS — H401132 Primary open-angle glaucoma, bilateral, moderate stage: Secondary | ICD-10-CM | POA: Diagnosis not present

## 2017-12-27 DIAGNOSIS — M5416 Radiculopathy, lumbar region: Secondary | ICD-10-CM | POA: Diagnosis not present

## 2017-12-27 DIAGNOSIS — M1711 Unilateral primary osteoarthritis, right knee: Secondary | ICD-10-CM | POA: Diagnosis not present

## 2017-12-27 DIAGNOSIS — G894 Chronic pain syndrome: Secondary | ICD-10-CM | POA: Diagnosis not present

## 2018-01-09 DIAGNOSIS — I350 Nonrheumatic aortic (valve) stenosis: Secondary | ICD-10-CM | POA: Diagnosis not present

## 2018-01-09 DIAGNOSIS — I340 Nonrheumatic mitral (valve) insufficiency: Secondary | ICD-10-CM | POA: Diagnosis not present

## 2018-01-22 DIAGNOSIS — I10 Essential (primary) hypertension: Secondary | ICD-10-CM | POA: Diagnosis not present

## 2018-01-22 DIAGNOSIS — Z955 Presence of coronary angioplasty implant and graft: Secondary | ICD-10-CM | POA: Diagnosis not present

## 2018-01-22 DIAGNOSIS — I2510 Atherosclerotic heart disease of native coronary artery without angina pectoris: Secondary | ICD-10-CM | POA: Diagnosis not present

## 2018-01-22 DIAGNOSIS — E782 Mixed hyperlipidemia: Secondary | ICD-10-CM | POA: Diagnosis not present

## 2018-01-24 DIAGNOSIS — R5383 Other fatigue: Secondary | ICD-10-CM | POA: Diagnosis not present

## 2018-01-24 DIAGNOSIS — Z23 Encounter for immunization: Secondary | ICD-10-CM | POA: Diagnosis not present

## 2018-01-24 DIAGNOSIS — R319 Hematuria, unspecified: Secondary | ICD-10-CM | POA: Diagnosis not present

## 2018-01-30 DIAGNOSIS — E785 Hyperlipidemia, unspecified: Secondary | ICD-10-CM | POA: Diagnosis not present

## 2018-01-30 DIAGNOSIS — F329 Major depressive disorder, single episode, unspecified: Secondary | ICD-10-CM | POA: Diagnosis not present

## 2018-01-30 DIAGNOSIS — R319 Hematuria, unspecified: Secondary | ICD-10-CM | POA: Diagnosis not present

## 2018-01-30 DIAGNOSIS — R5383 Other fatigue: Secondary | ICD-10-CM | POA: Diagnosis not present

## 2018-02-02 ENCOUNTER — Ambulatory Visit (INDEPENDENT_AMBULATORY_CARE_PROVIDER_SITE_OTHER): Payer: Medicare Other | Admitting: Ophthalmology

## 2018-02-02 DIAGNOSIS — H401132 Primary open-angle glaucoma, bilateral, moderate stage: Secondary | ICD-10-CM | POA: Diagnosis not present

## 2018-02-02 DIAGNOSIS — H401131 Primary open-angle glaucoma, bilateral, mild stage: Principal | ICD-10-CM

## 2018-02-04 DIAGNOSIS — L853 Xerosis cutis: Secondary | ICD-10-CM | POA: Diagnosis not present

## 2018-02-04 DIAGNOSIS — D3610 Benign neoplasm of peripheral nerves and autonomic nervous system, unspecified: Secondary | ICD-10-CM | POA: Diagnosis not present

## 2018-02-04 DIAGNOSIS — L814 Other melanin hyperpigmentation: Secondary | ICD-10-CM | POA: Diagnosis not present

## 2018-02-04 DIAGNOSIS — D1801 Hemangioma of skin and subcutaneous tissue: Secondary | ICD-10-CM | POA: Diagnosis not present

## 2018-02-04 DIAGNOSIS — L816 Other disorders of diminished melanin formation: Secondary | ICD-10-CM | POA: Diagnosis not present

## 2018-02-04 DIAGNOSIS — L820 Inflamed seborrheic keratosis: Secondary | ICD-10-CM | POA: Diagnosis not present

## 2018-02-04 DIAGNOSIS — I781 Nevus, non-neoplastic: Secondary | ICD-10-CM | POA: Diagnosis not present

## 2018-02-04 DIAGNOSIS — L570 Actinic keratosis: Secondary | ICD-10-CM | POA: Diagnosis not present

## 2018-02-20 DIAGNOSIS — R3129 Other microscopic hematuria: Secondary | ICD-10-CM | POA: Diagnosis not present

## 2018-02-20 DIAGNOSIS — N401 Enlarged prostate with lower urinary tract symptoms: Secondary | ICD-10-CM | POA: Diagnosis not present

## 2018-02-20 DIAGNOSIS — N419 Inflammatory disease of prostate, unspecified: Secondary | ICD-10-CM | POA: Diagnosis not present

## 2018-03-01 IMAGING — CR DG ABDOMEN 2V
3 series · 3 of 3 positions shown · non-contrast
Comparison: None.

CLINICAL DATA: Diffuse lower abdominal pain and periumbilical pain

EXAM:
ABDOMEN - 2 VIEW

[abdomen erect]
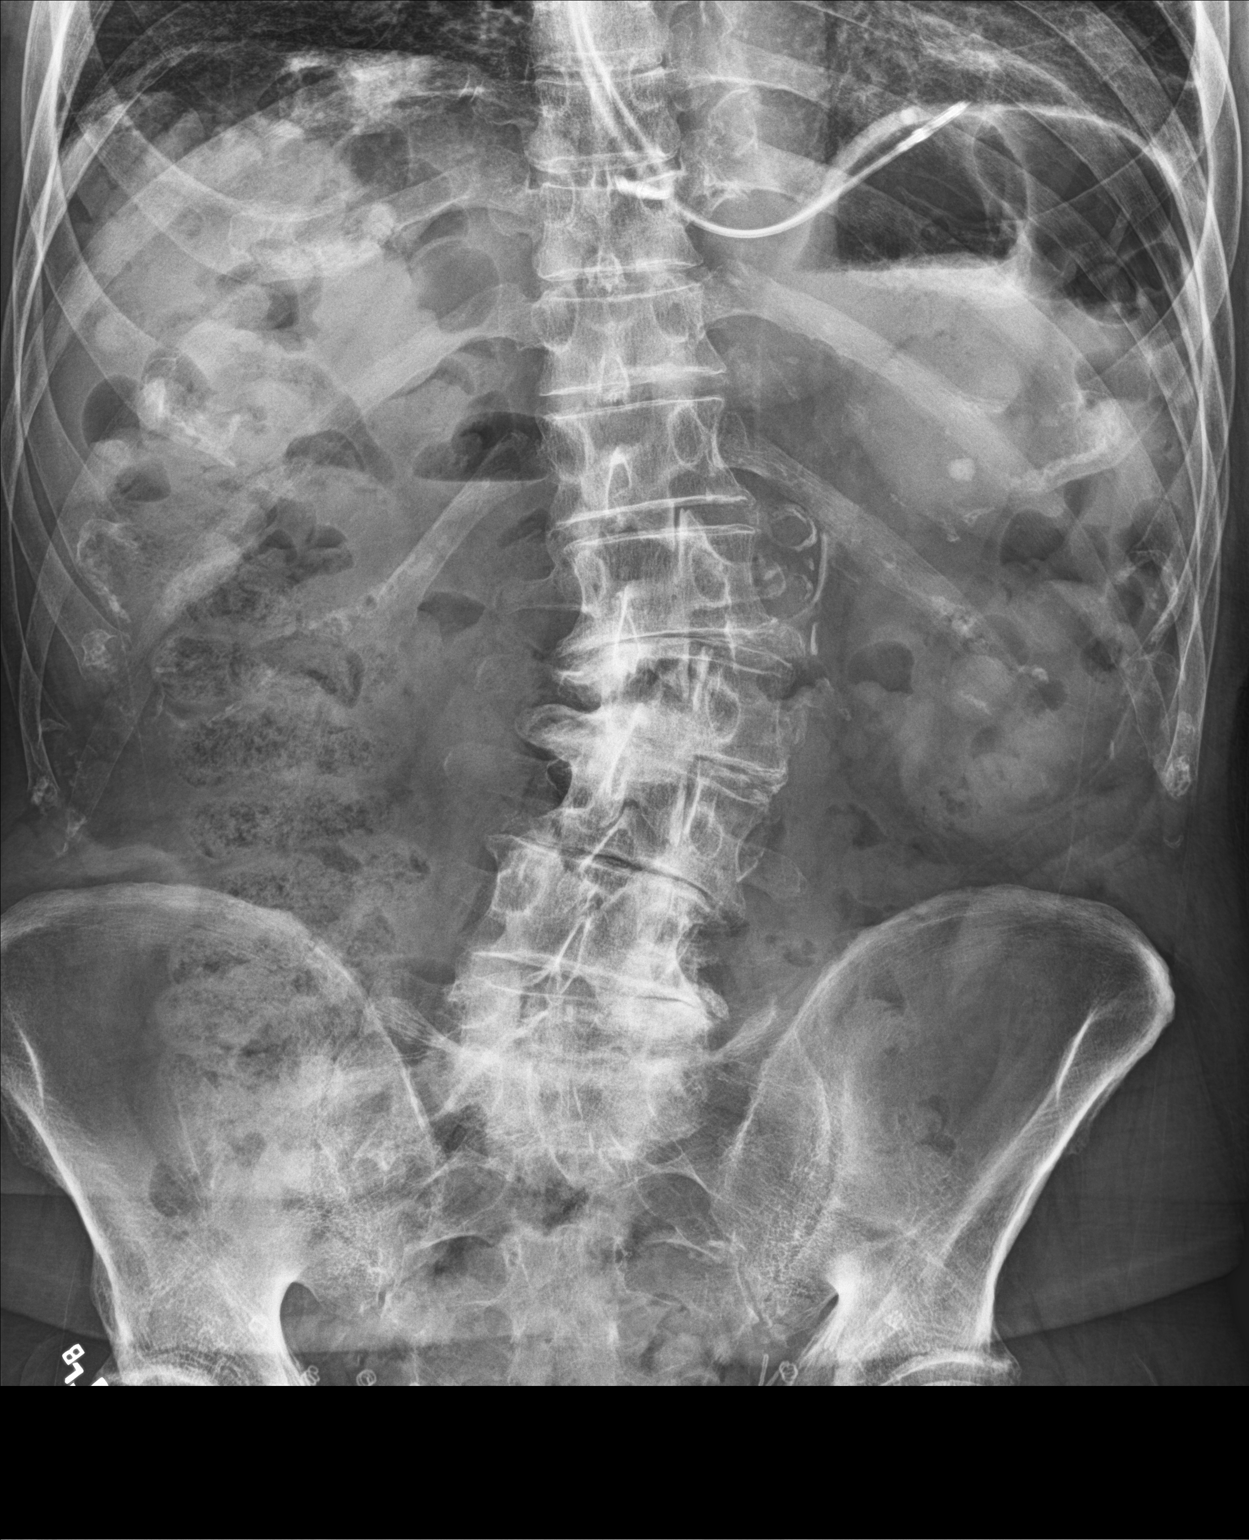

[abdomen supine (1 of 2)]
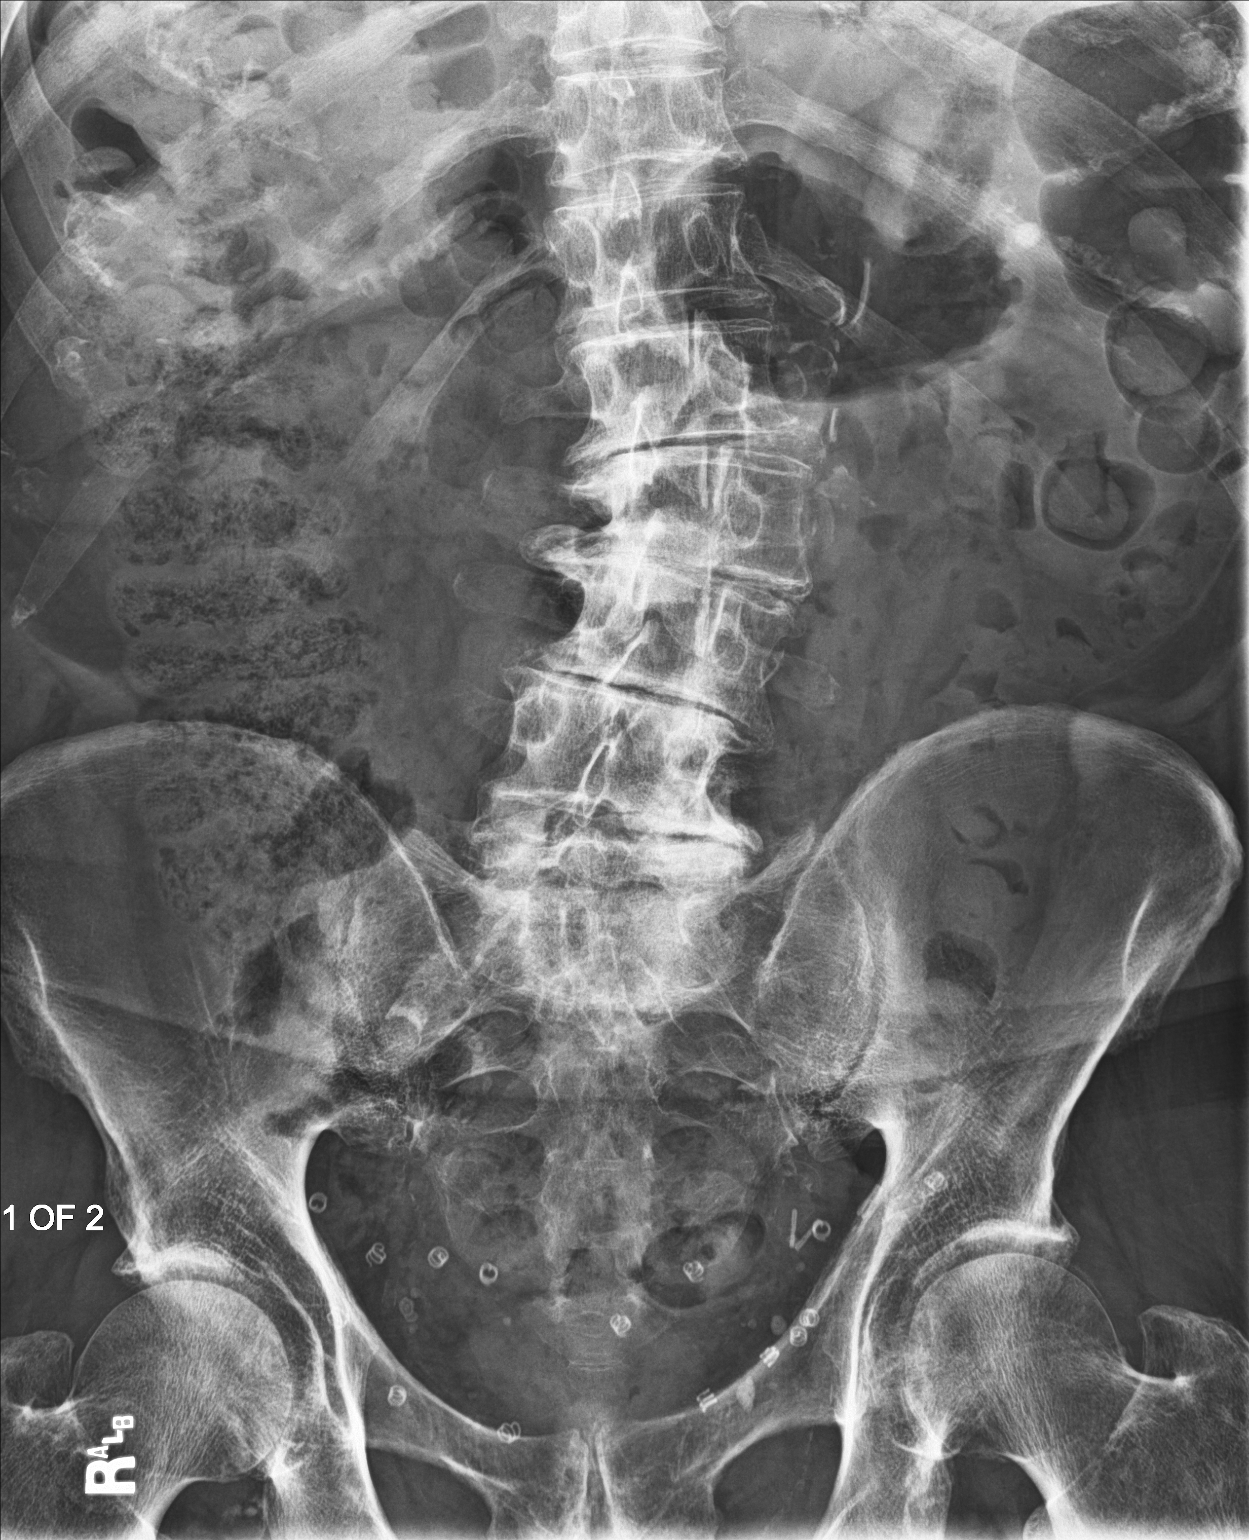

[abdomen supine (2 of 2)]
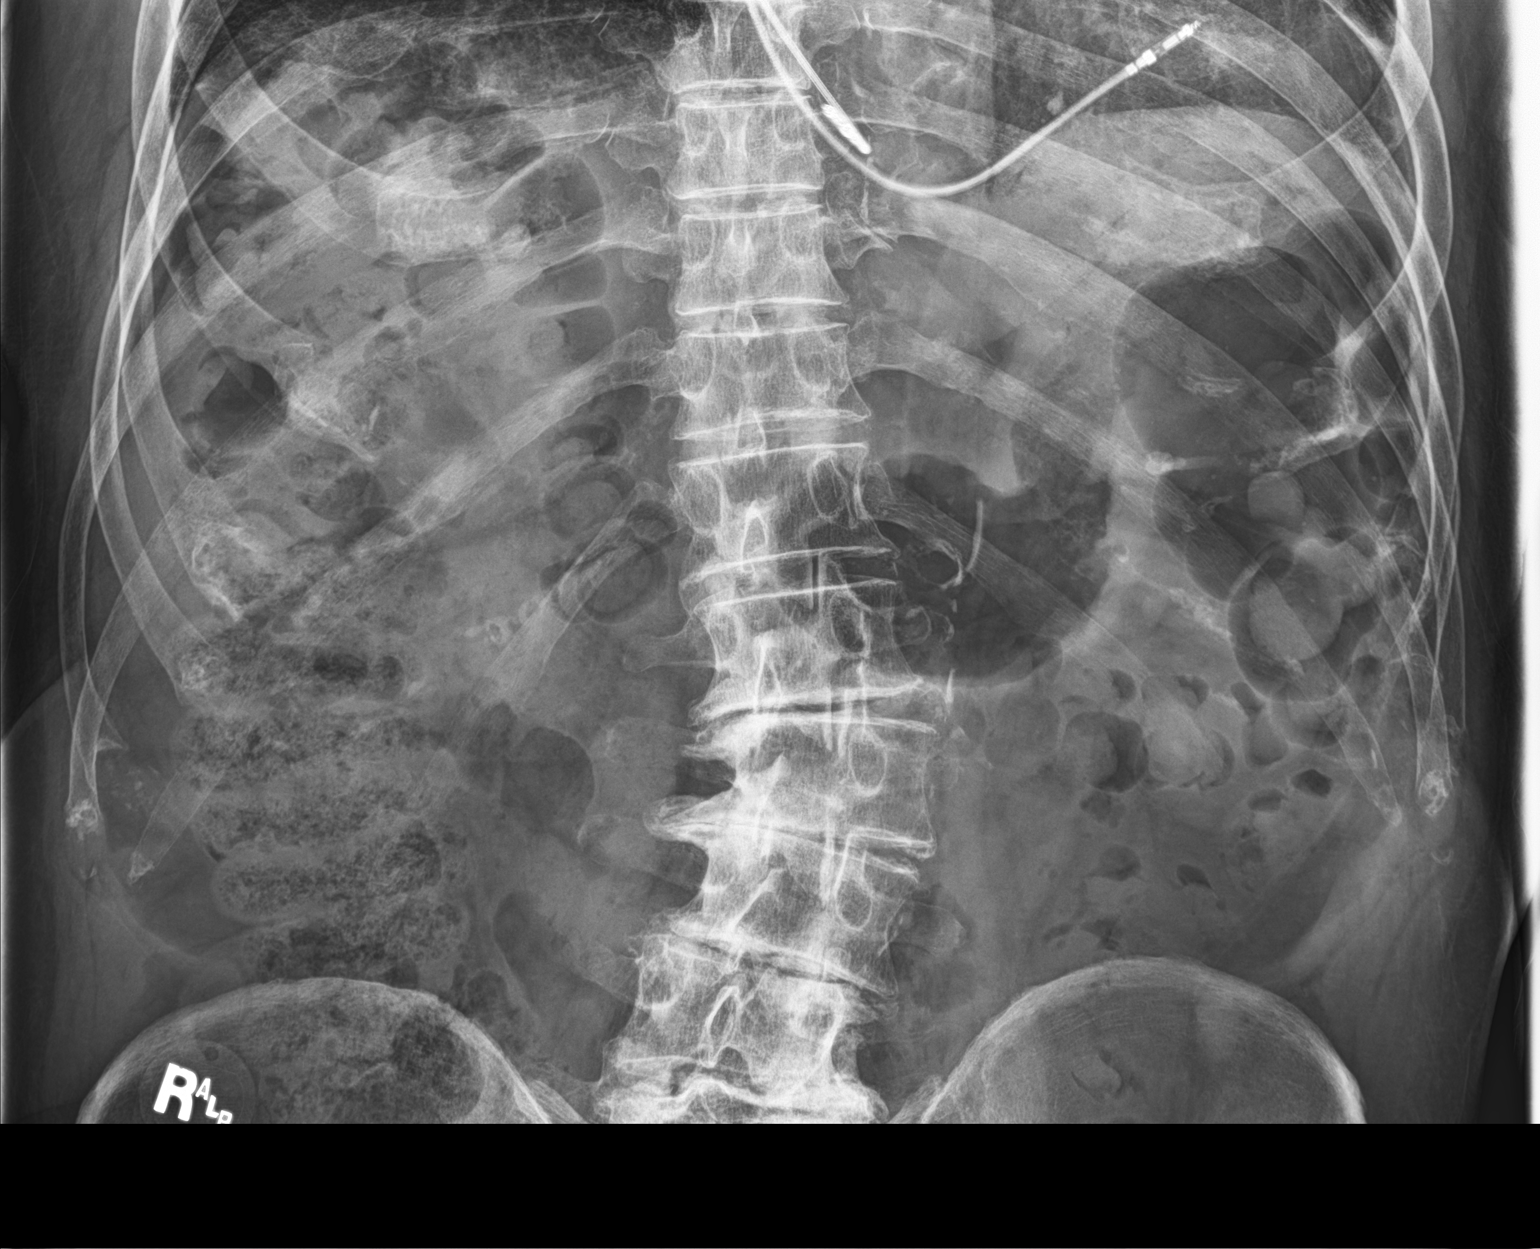

[3 of 3 positions shown; findings below may reference images not displayed]

FINDINGS: Visualized lung bases clear. Partially visualized right atrial and
right ventricular pacer leads. Bowel gas pattern is nonobstructed.
Large amount of impacted feces in the right colon. Aortic vascular
calcifications. Postsurgical changes lower pelvis. No acute osseous
abnormality.
IMPRESSION: Non obstructed gas pattern with large amount of right colon stool.

## 2018-03-14 DIAGNOSIS — M1711 Unilateral primary osteoarthritis, right knee: Secondary | ICD-10-CM | POA: Diagnosis not present

## 2018-03-14 DIAGNOSIS — M5416 Radiculopathy, lumbar region: Secondary | ICD-10-CM | POA: Diagnosis not present

## 2018-03-14 DIAGNOSIS — G894 Chronic pain syndrome: Secondary | ICD-10-CM | POA: Diagnosis not present

## 2018-03-15 DIAGNOSIS — G4733 Obstructive sleep apnea (adult) (pediatric): Secondary | ICD-10-CM | POA: Diagnosis not present

## 2018-03-15 DIAGNOSIS — R5382 Chronic fatigue, unspecified: Secondary | ICD-10-CM | POA: Diagnosis not present

## 2018-03-25 DIAGNOSIS — I10 Essential (primary) hypertension: Secondary | ICD-10-CM | POA: Diagnosis not present

## 2018-03-25 DIAGNOSIS — G4733 Obstructive sleep apnea (adult) (pediatric): Secondary | ICD-10-CM | POA: Diagnosis not present

## 2018-03-25 DIAGNOSIS — I2510 Atherosclerotic heart disease of native coronary artery without angina pectoris: Secondary | ICD-10-CM | POA: Diagnosis not present

## 2018-04-01 DIAGNOSIS — G4733 Obstructive sleep apnea (adult) (pediatric): Secondary | ICD-10-CM | POA: Diagnosis not present

## 2018-04-15 DIAGNOSIS — Z950 Presence of cardiac pacemaker: Secondary | ICD-10-CM | POA: Diagnosis not present

## 2018-08-03 ENCOUNTER — Ambulatory Visit (INDEPENDENT_AMBULATORY_CARE_PROVIDER_SITE_OTHER): Payer: Medicare Other | Admitting: Ophthalmology

## 2018-08-10 ENCOUNTER — Ambulatory Visit (INDEPENDENT_AMBULATORY_CARE_PROVIDER_SITE_OTHER): Payer: Medicare Other | Admitting: Ophthalmology

## 2018-10-02 ENCOUNTER — Ambulatory Visit (INDEPENDENT_AMBULATORY_CARE_PROVIDER_SITE_OTHER): Payer: Medicare Other | Admitting: Ophthalmology

## 2018-10-02 DIAGNOSIS — H401130 Primary open-angle glaucoma, bilateral, stage unspecified: Secondary | ICD-10-CM

## 2019-03-24 DIAGNOSIS — H401132 Primary open-angle glaucoma, bilateral, moderate stage: Secondary | ICD-10-CM | POA: Insufficient documentation

## 2019-03-24 NOTE — Progress Notes (Signed)
PAST OCULAR HISTORY:  1. POAG OU   - Gonio: pseudophakic   - [place] photo:    - Tmax: 33/53   - Target: 15/15   - OD: ALT 1987, ALT 2003, trab/mmc 04/14/02, CEIOL 08/11/02   - OS: ALT 1987, ALT 1995, [place], [facility] 11/1999, trab+[facility] 01/2002  2. ERM OD s/p EMP 2012    IMAGING:  - RNFL   OD: low quality,  full,  no prior   OS: low quality,  thin,  no prior     ASSESSMENT/PLAN:  POAG OU  - Meds: none              Intolerances: Br, Lat, CAI  - IOP: at goal OD and borderline OS   - Plan: CPM given IOP at goal, defer further meds given numerous intolerances              Is having stress test in near future so will delay f/u given prior stability while he recovers    RTC 6 mo  HVF 24-2 OU  RNFL OU

## 2019-03-28 ENCOUNTER — Ambulatory Visit (INDEPENDENT_AMBULATORY_CARE_PROVIDER_SITE_OTHER): Payer: Medicare Other | Admitting: Ophthalmology

## 2019-03-28 DIAGNOSIS — H401132 Primary open-angle glaucoma, bilateral, moderate stage: Secondary | ICD-10-CM

## 2019-06-30 ENCOUNTER — Encounter (INDEPENDENT_AMBULATORY_CARE_PROVIDER_SITE_OTHER): Payer: Self-pay

## 2019-07-14 ENCOUNTER — Telehealth (INDEPENDENT_AMBULATORY_CARE_PROVIDER_SITE_OTHER): Payer: Self-pay

## 2019-07-14 NOTE — Telephone Encounter (Signed)
Patient was identified from a Psychologist, educational for follow up.  Confirmed that patient already had previous COVID vaccinations done at [facility] on 06/06/19 and 07/04/19 and updated the chart to reflect.

## 2019-09-01 ENCOUNTER — Encounter (INDEPENDENT_AMBULATORY_CARE_PROVIDER_SITE_OTHER): Payer: Self-pay | Admitting: Ophthalmology

## 2019-09-01 DIAGNOSIS — H409 Unspecified glaucoma: Secondary | ICD-10-CM

## 2019-09-25 NOTE — Progress Notes (Signed)
PAST OCULAR HISTORY:  1. POAG OU   - Gonio: pseudophakic   - ONH photo: -   - Tmax: 33/53   - Target: 15/15   - OD: ALT 1987, ALT 2003, trab/[facility] 04/14/02, CEIOL 08/11/02   - OS: ALT 1987, ALT 1995, [place], trab+[facility] 11/1999, trab+[facility] 01/2002  2. ERM OD s/p EMP 2012    IMAGING:  - [employer] Visual [employer]   OD: reliable,  biarcuate,  stable   OS: reliable,  superior arcuate,  stable  - RNFL   OD: low quality,  thin,  possible progression   OS: low quality,  thin,  stable     ASSESSMENT/PLAN:  POAG OU  - Meds: none              Intolerances: Br, Lat, CAI  - IOP: at goal OD and above goal OS   - Plan: IOP above goal OS however testing stable (possible progression OD may be subject to low quality scan). Defer further meds given numerous intolerances for now, CPM. Could consider Zio in the future if needs additional pressure lowering. For now start massage OS and monitor for response.             Will trial GCC at f/u to monitor for hypotony maculopathy at next visit.    RTC 6 mo  HVF 24-2 OU  DFE  [place] OU

## 2019-09-26 ENCOUNTER — Ambulatory Visit (INDEPENDENT_AMBULATORY_CARE_PROVIDER_SITE_OTHER): Payer: Medicare Other

## 2019-09-26 ENCOUNTER — Ambulatory Visit (INDEPENDENT_AMBULATORY_CARE_PROVIDER_SITE_OTHER): Payer: Medicare Other | Admitting: Ophthalmology

## 2019-09-26 DIAGNOSIS — H401132 Primary open-angle glaucoma, bilateral, moderate stage: Secondary | ICD-10-CM

## 2019-09-26 DIAGNOSIS — H409 Unspecified glaucoma: Secondary | ICD-10-CM

## 2020-02-09 ENCOUNTER — Ambulatory Visit (INDEPENDENT_AMBULATORY_CARE_PROVIDER_SITE_OTHER): Payer: Medicare Other | Admitting: Optometrist

## 2020-02-09 ENCOUNTER — Encounter (INDEPENDENT_AMBULATORY_CARE_PROVIDER_SITE_OTHER): Payer: Self-pay | Admitting: Optometrist

## 2020-02-09 DIAGNOSIS — H401134 Primary open-angle glaucoma, bilateral, indeterminate stage: Secondary | ICD-10-CM

## 2020-02-09 DIAGNOSIS — H35351 Cystoid macular degeneration, right eye: Secondary | ICD-10-CM

## 2020-02-09 DIAGNOSIS — D3131 Benign neoplasm of right choroid: Secondary | ICD-10-CM

## 2020-02-09 DIAGNOSIS — H539 Unspecified visual disturbance: Secondary | ICD-10-CM

## 2020-02-09 NOTE — Progress Notes (Signed)
Ophthalmology Service  OPTOMETRY PROGRESS NOTE      HPI     Reasons for Visit: urgent care OS     85 year old Franklin Davis reported the following symptoms: curtain inf/temp OD x 3 days; no flashes or floaters; OD blurred x 3 days      Eye Medications: none    POH:  (+) Eye Surgery:  trab ou; sp CEIOL ou; retinal laser ou  (-) Ocular Trauma  (+) Eye Disease:POAG     PMHX:  CAD, arthritis    No results found for: A1C      Last edited by Jama Flavors, OD on 4/56/2563  4:21 PM. (History)          Assessment/Plan  1. Visual disturbance    optos  OD: superior laser, nevus  OS: wnl    Oct macula  OD:irf  OS: drusen    - Fundus Photos - OU - Both Eyes -[facility]  - OCT, Retina - OU - Both Eyes -[place]    2. CME (cystoid macular edema), right  Unclear etiology,discussed w Dr. Nicoletta Dress, fu w retina clinic x 2 weeks    3. Primary open angle glaucoma (POAG) of both eyes, indeterminate stage  iop stable ou, cont fu w Dr. Tyler Pita    4. Choroidal nevus, right  Observe    5. Ho ERM OD:   s/p EMP 2012, fu w retina clinic        RTC in Return in about 2 weeks (around 02/23/2020) for retina clinic (pls ob per Dr. Nicoletta Dress).      Franklin Davis, O.D., F.A.A.O., [phone]

## 2020-02-23 ENCOUNTER — Ambulatory Visit (INDEPENDENT_AMBULATORY_CARE_PROVIDER_SITE_OTHER): Payer: Medicare Other

## 2020-03-08 ENCOUNTER — Encounter (INDEPENDENT_AMBULATORY_CARE_PROVIDER_SITE_OTHER): Payer: Self-pay | Admitting: Ophthalmology

## 2020-03-08 DIAGNOSIS — H409 Unspecified glaucoma: Secondary | ICD-10-CM

## 2020-03-30 NOTE — Progress Notes (Signed)
PAST OCULAR HISTORY:  1. POAG OU              - Gonio: pseudophakic              - [place] photo: 02/09/20 (Optos)              - Tmax: 33/53              - Target: 15/15              - OD: ALT 1987, ALT 2003, trab/mmc 04/14/02, CEIOL 08/11/02              - OS: ALT 1987, ALT 1995, [place], trab+[facility] 11/1999, trab+[facility] 01/2002  2. ERM OD s/p EMP 2012    IMAGING:  - Humphrey Visual Field   OD: reliable,  biarcuate,  stable   OS: reliable,  inferior arcutae,  stable  - RNFL  OD: borderline,  stable  OS: thin,  stable      ASSESSMENT/PLAN:  POAG OU  - Meds: none              Intolerances: Br, Lat, CAI  - IOP: at goal OU   - Plan: At last visit started massage OS. Low threshold to start Zio.   Seen in September w/ decreased vision OD and significant IRF. Choroid appeared flat so unlikely hypotonous. Given trab functioning well can start PF QID. Went to [facility] 9/24 and 10/20 and was diagnosed with wet AMD and received injections OD, dry AMD OS.      RTC 6 mo  HVF 24-2 OU  RNFL

## 2020-04-02 ENCOUNTER — Ambulatory Visit (INDEPENDENT_AMBULATORY_CARE_PROVIDER_SITE_OTHER): Payer: Medicare Other | Admitting: Ophthalmology

## 2020-04-02 ENCOUNTER — Ambulatory Visit (INDEPENDENT_AMBULATORY_CARE_PROVIDER_SITE_OTHER): Payer: Medicare Other

## 2020-04-02 DIAGNOSIS — H409 Unspecified glaucoma: Secondary | ICD-10-CM

## 2020-04-02 DIAGNOSIS — H401132 Primary open-angle glaucoma, bilateral, moderate stage: Secondary | ICD-10-CM

## 2020-09-15 ENCOUNTER — Encounter (INDEPENDENT_AMBULATORY_CARE_PROVIDER_SITE_OTHER): Payer: Self-pay | Admitting: Ophthalmology

## 2020-09-15 DIAGNOSIS — H409 Unspecified glaucoma: Secondary | ICD-10-CM

## 2020-09-28 NOTE — Progress Notes (Signed)
PAST OCULAR HISTORY:  1. POAG OU              - Gonio: pseudophakic              - [place] photo: 02/09/20 (Optos)              - Tmax: 33/53              - Target: 15/15              - OD: ALT 1987, ALT 2003, trab/mmc 04/14/02, CEIOL 08/11/02              - OS: ALT 1987, ALT 1995, [place], trab+[facility] 11/1999, trab+[facility] 01/2002  2. ERM OD s/p EMP 2012  3. AMD OU: f/b [facility]   - OD: s/p IVA    IMAGING:  - Humphrey Visual Field   OD: reliable,  inferior arcutae,  stable   OS: reliable,  superior arcuate,  stable  - RNFL   OD: full,  stable   OS: borderline,  stable       ASSESSMENT/PLAN:  POAG OU  - Meds: Push 0/2              Intolerances: Br, Lat, CAI  - IOP: at goal OU   - Plan: Low threshold to start Zio.   Has been receiving injection OD, having some improvement in vision.         RTC 6 mo  HVF 24-2 OU  DFE  RNFL

## 2020-10-01 ENCOUNTER — Ambulatory Visit (INDEPENDENT_AMBULATORY_CARE_PROVIDER_SITE_OTHER): Payer: Medicare Other

## 2020-10-01 ENCOUNTER — Ambulatory Visit (INDEPENDENT_AMBULATORY_CARE_PROVIDER_SITE_OTHER): Payer: Medicare Other | Admitting: Ophthalmology

## 2020-10-01 DIAGNOSIS — H401132 Primary open-angle glaucoma, bilateral, moderate stage: Secondary | ICD-10-CM

## 2020-10-01 DIAGNOSIS — H409 Unspecified glaucoma: Secondary | ICD-10-CM

## 2021-02-28 ENCOUNTER — Encounter (INDEPENDENT_AMBULATORY_CARE_PROVIDER_SITE_OTHER): Payer: Self-pay

## 2021-02-28 DIAGNOSIS — H401132 Primary open-angle glaucoma, bilateral, moderate stage: Secondary | ICD-10-CM

## 2021-04-07 NOTE — Progress Notes (Signed)
PAST OCULAR HISTORY:  1. POAG OU              - Gonio: pseudophakic              - [place] photo: 02/09/20 (Optos)              - Tmax: 33/53              - Target: 15/15              - OD: ALT 1987, ALT 2003, trab/mmc 04/14/02, CEIOL 08/11/02              - OS: ALT 1987, ALT 1995, [place], [facility] 11/1999, trab+[facility] 01/2002  2. ERM OD s/p EMP 2012  3. AMD OU: f/b [facility]   - OD: s/p IVA    IMAGING:  - Humphrey Visual Field   OD: reliable,  superior arcuate,  stable   OS: reliable,  superior arcuate,  stable within variability  - RNFL   OD: full,  stable   OS: thin,  stable       ASSESSMENT/PLAN:  POAG OU  - Meds: Push 0/2              Intolerances: Br, Lat, CAI  - IOP: at goal OU    - Plan: Low threshold to start Zio.   Has been receiving injection OD, having some improvement in vision although worse again today.   Will decrease HVF to annual per request. His AMD influences OD and his lid influences OS.    Could avoid future dilation and do nDFE since he gets regular dilation w/ Retina.         RTC 6 mo  RNFL

## 2021-04-08 ENCOUNTER — Ambulatory Visit (INDEPENDENT_AMBULATORY_CARE_PROVIDER_SITE_OTHER): Payer: Medicare Other | Admitting: Ophthalmology

## 2021-04-08 ENCOUNTER — Ambulatory Visit (INDEPENDENT_AMBULATORY_CARE_PROVIDER_SITE_OTHER): Payer: Medicare Other

## 2021-04-08 DIAGNOSIS — H401132 Primary open-angle glaucoma, bilateral, moderate stage: Secondary | ICD-10-CM

## 2021-04-08 MED ORDER — OXYCODONE HCL 5 MG OR TABS
ORAL_TABLET | ORAL | Status: AC
Start: 2021-02-01 — End: ?

## 2021-04-08 MED ORDER — GEMTESA 75 MG PO TABS: ORAL_TABLET | ORAL | Status: AC

## 2021-04-08 MED ORDER — CHOLECALCIFEROL 25 MCG (1000 UT) PO CHEW: 25.0000 ug | CHEWABLE_TABLET | ORAL | Status: AC

## 2021-04-08 MED ORDER — LEVOTHYROXINE SODIUM 100 MCG OR TABS: 100.0000 ug | ORAL_TABLET | ORAL | Status: AC

## 2021-04-08 MED ORDER — GABAPENTIN 300 MG OR CAPS: 300.0000 mg | ORAL_CAPSULE | ORAL | Status: AC

## 2021-04-08 MED ORDER — LOSARTAN POTASSIUM 25 MG OR TABS
25.0000 mg | ORAL_TABLET | ORAL | Status: AC
Start: 2020-08-02 — End: 2021-08-02

## 2021-04-08 MED ORDER — METOPROLOL TARTRATE 25 MG OR TABS
25.0000 mg | ORAL_TABLET | ORAL | Status: AC
Start: 2021-01-03 — End: 2022-01-03

## 2021-04-08 MED ORDER — BUPRENORPHINE 20 MCG/HR TD PTWK: 10.0000 ug | MEDICATED_PATCH | TRANSDERMAL | Status: AC

## 2021-04-08 MED ORDER — ATORVASTATIN CALCIUM 20 MG OR TABS
20.0000 mg | ORAL_TABLET | ORAL | Status: AC
Start: 2021-01-23 — End: 2022-01-23

## 2021-04-08 MED ORDER — PRESERVISION AREDS OR CAPS: ORAL_CAPSULE | ORAL | Status: AC

## 2021-04-08 MED ORDER — LORAZEPAM 1 MG OR TABS: 1.0000 mg | ORAL_TABLET | ORAL | Status: AC

## 2021-04-08 MED ORDER — ASPIRIN 81 MG OR CHEW: 81.0000 mg | CHEWABLE_TABLET | ORAL | Status: AC

## 2021-10-06 NOTE — Progress Notes (Signed)
PAST OCULAR HISTORY:  1. POAG OU              - Gonio: pseudophakic              - [place] photo: 02/09/20 (Optos)              - Tmax: 33/53              - Target: 15/15              - OD: ALT 1987, ALT 2003, trab/mmc 04/14/02, CEIOL 08/11/02              - OS: ALT 1987, ALT 1995, [place], [facility] 11/1999, trab+[facility] 01/2002  2. ERM OD s/p EMP 2012  3. AMD OU: f/b [facility]   - OD: s/p IVA    IMAGING:  - RNFL   OD: low quality,  thin,  stable   OS: thin,  stable       ASSESSMENT/PLAN:  POAG OU  - Meds: Push 0/2              Intolerances: Br, Lat, CAI  - IOP: at goal OU     - Plan: Low threshold to start Zio.   Will decrease HVF to annual per request. His AMD influences OD and his lid influences OS.   Can avoid future dilation and do nDFE per request since he gets regular dilation w/ Retina.         RTC 6 mo  HVF 24-2 OU (taped lids)  RNFL

## 2021-10-07 ENCOUNTER — Ambulatory Visit (INDEPENDENT_AMBULATORY_CARE_PROVIDER_SITE_OTHER): Payer: Medicare Other | Admitting: Ophthalmology

## 2021-10-07 DIAGNOSIS — H401132 Primary open-angle glaucoma, bilateral, moderate stage: Secondary | ICD-10-CM

## 2021-11-30 ENCOUNTER — Ambulatory Visit (INDEPENDENT_AMBULATORY_CARE_PROVIDER_SITE_OTHER)

## 2022-04-10 NOTE — Progress Notes (Signed)
PAST OCULAR HISTORY:  1. POAG OU              - Gonio: pseudophakic              - [place] photo: 02/09/20 (Optos)              - Tmax: 33/53              - Target: 15/15              - OD: ALT 1987, ALT 2003, trab/mmc 04/14/02, CEIOL 08/11/02              - OS: ALT 1987, ALT 1995, [place], trab+[facility] 11/1999, trab+[facility] 01/2002  2. ERM OD s/p EMP 2012  3. AMD OU: f/b [facility]   - OD: s/p IVA     IMAGING:  - Humphrey Visual Field   OD: reliable,  biarcuate,  stable   OS: reliable,  superior arcuate,  stable  - RNFL   OD: borderline,  stable   OS: thin,  stable        ASSESSMENT/PLAN:  POAG OU  - Meds: Push 0/2              Intolerances: Br, Lat, CAI  - IOP: at goal OU      - Plan: Low threshold to start Zio.   Will decrease HVF  (taped) to annual per request. His AMD influences OD and his lid influences OS. Can avoid future dilation and do nDFE per request since he gets regular dilation w/ Retina.   Testing is stable today. Continue to observe off drops.      RTC 6 mo  RNFL

## 2022-04-21 ENCOUNTER — Ambulatory Visit (INDEPENDENT_AMBULATORY_CARE_PROVIDER_SITE_OTHER): Payer: Medicare Other | Admitting: Ophthalmology

## 2022-04-21 ENCOUNTER — Ambulatory Visit (INDEPENDENT_AMBULATORY_CARE_PROVIDER_SITE_OTHER): Payer: Medicare Other

## 2022-04-21 DIAGNOSIS — H401132 Primary open-angle glaucoma, bilateral, moderate stage: Secondary | ICD-10-CM

## 2022-10-24 NOTE — Progress Notes (Addendum)
PAST OCULAR HISTORY:  1. POAG OU              - Gonio: pseudophakic              - ONH photo: 02/09/20 (Optos)              - Tmax: 33/53              - Target: 15/15              - OD: ALT 1987, ALT 2003, trab/mmc 04/14/02, CEIOL 08/11/02              - OS: ALT 1987, ALT 1995, CEIOL 1999, trab+[facility] 11/1999, trab+[facility] 01/2002  2. ERM OD s/p EMP 2012  3. AMD OU: f/b  [facility]   - OD: s/p IVA     IMAGING:  - RNFL   OD: full,  stable   OS: thin,  stable . Poor signal       ASSESSMENT/PLAN:  POAG OU  - Meds: Push 0/2              Intolerances: Br, Lat, CAI  - IOP: at goal OU   - Plan: Low threshold to start Zio.   Decreased HVF  (taped) to annual per request. His AMD influences OD and his lid influences OS. RNFL is very reassuring, particularly OS, and the HVF is not c/w the findings. May have gotten trabs, particularly OD, more for OHTN than true glaucoma.   Testing is stable today. Continue to observe off drops. Will increase to annual testing since seeing Dr. Marvis Repress regularly.      RTC 12 mo  HVF 24-2 OU  DFE  RNFL

## 2022-10-27 ENCOUNTER — Ambulatory Visit (INDEPENDENT_AMBULATORY_CARE_PROVIDER_SITE_OTHER): Payer: Medicare Other | Admitting: Ophthalmology

## 2022-10-27 DIAGNOSIS — H401132 Primary open-angle glaucoma, bilateral, moderate stage: Secondary | ICD-10-CM

## 2022-11-16 ENCOUNTER — Encounter (INDEPENDENT_AMBULATORY_CARE_PROVIDER_SITE_OTHER): Payer: Self-pay | Admitting: Ophthalmology

## 2022-12-08 ENCOUNTER — Encounter (INDEPENDENT_AMBULATORY_CARE_PROVIDER_SITE_OTHER): Payer: Self-pay | Admitting: Ophthalmology

## 2023-01-02 ENCOUNTER — Encounter (INDEPENDENT_AMBULATORY_CARE_PROVIDER_SITE_OTHER): Payer: Self-pay | Admitting: Ophthalmology

## 2023-02-12 ENCOUNTER — Encounter (INDEPENDENT_AMBULATORY_CARE_PROVIDER_SITE_OTHER): Payer: Self-pay | Admitting: Ophthalmology

## 2023-03-24 NOTE — Progress Notes (Signed)
Subjective:  89 yo man   right foot pain   no trauma    The pain is quite intermittent.    Not really activity related   Intermittent discomfort posterolateral aspect of the heel.  Sometimes the big toe.    He is wearing [employer].    Xrays:  Modest degenerative change throughout the foot      Objective:     normal foot alignment   Normal arch   No swelling or skin changes   Ankle ligaments stable   No focal tenderness      Allergies:   Allergies as of 03/27/2023 - Verified 10/27/2022   Allergen Reaction Noted    Tetracyclines Rash 01/01/2009       Medications:   Current Outpatient Medications   Medication Sig Dispense Refill    aspirin 81 MG chewable tablet 1 tablet (81 mg) by Oral route.      atorvastatin (LIPITOR) 20 MG tablet 1 tablet (20 mg) by Oral route.      buprenorphine (BUTRANS) 20 mcg/hr Apply 10 mcg topically.      Cholecalciferol 25 MCG (1000 UT) CHEW 25 mcg by Oral route.      gabapentin (NEURONTIN) 300 MG capsule 1 capsule (300 mg) by Oral route.      levothyroxine (SYNTHROID) 100 MCG tablet 1 tablet (100 mcg) by Oral route.      LORazepam (ATIVAN) 1 MG tablet 1 tablet (1 mg) by Oral route.      losartan (COZAAR) 25 MG tablet 1 tablet (25 mg) by Oral route.      metoprolol tartrate (LOPRESSOR) 25 MG tablet 1 tablet (25 mg) by Oral route.      Multiple Vitamins-Minerals (PRESERVISION AREDS) CAPS by Oral route.      oxyCODONE (ROXICODONE) 5 MG immediate release tablet SMARTSIG:1-2 Tablet(s) By Mouth Every Evening PRN      Vibegron (GEMTESA) 75 MG TABS by Oral route.         Past Medical History:  No past medical history on file.    Past Surgical History:    No past surgical history on file.     Assessment:    modest  global arthrosis right foot        Plan:     I would not recommend any surgery or injections     We discussed shoewear.    We went over [place] shoes, Superfeet liners, semicustom orthotics.    He might check out these options at [facility].

## 2023-03-26 ENCOUNTER — Encounter (INDEPENDENT_AMBULATORY_CARE_PROVIDER_SITE_OTHER): Payer: Self-pay | Admitting: Ophthalmology

## 2023-03-27 ENCOUNTER — Ambulatory Visit (INDEPENDENT_AMBULATORY_CARE_PROVIDER_SITE_OTHER): Payer: Medicare Other | Admitting: Orthopaedic Surgery

## 2023-03-27 NOTE — Procedures (Signed)
No note

## 2023-05-28 ENCOUNTER — Encounter (INDEPENDENT_AMBULATORY_CARE_PROVIDER_SITE_OTHER): Payer: Self-pay | Admitting: Ophthalmology

## 2023-07-16 ENCOUNTER — Encounter (INDEPENDENT_AMBULATORY_CARE_PROVIDER_SITE_OTHER): Payer: Self-pay | Admitting: Ophthalmology

## 2023-12-28 ENCOUNTER — Encounter (INDEPENDENT_AMBULATORY_CARE_PROVIDER_SITE_OTHER): Payer: Self-pay

## 2023-12-28 ENCOUNTER — Encounter (INDEPENDENT_AMBULATORY_CARE_PROVIDER_SITE_OTHER)

## 2023-12-28 ENCOUNTER — Ambulatory Visit (INDEPENDENT_AMBULATORY_CARE_PROVIDER_SITE_OTHER): Admitting: Ophthalmology

## 2023-12-28 DIAGNOSIS — H401132 Primary open-angle glaucoma, bilateral, moderate stage: Secondary | ICD-10-CM
